# Patient Record
Sex: Male | Born: 1965 | Race: Black or African American | Hispanic: No | Marital: Single | Smoking: Never smoker
Health system: Southern US, Community
[De-identification: ages and names within clinical notes are randomized; demographics above are authoritative.]

## PROBLEM LIST (undated history)

## (undated) DIAGNOSIS — N529 Male erectile dysfunction, unspecified: Secondary | ICD-10-CM

## (undated) DIAGNOSIS — I10 Essential (primary) hypertension: Secondary | ICD-10-CM

## (undated) HISTORY — PX: HAND SURGERY: SHX662

## (undated) HISTORY — DX: Male erectile dysfunction, unspecified: N52.9

## (undated) HISTORY — DX: Essential (primary) hypertension: I10

---

## 2017-10-10 DIAGNOSIS — I10 Essential (primary) hypertension: Secondary | ICD-10-CM

## 2017-10-10 DIAGNOSIS — C801 Malignant (primary) neoplasm, unspecified: Secondary | ICD-10-CM

## 2017-10-10 HISTORY — DX: Essential (primary) hypertension: I10

## 2017-10-10 HISTORY — DX: Malignant (primary) neoplasm, unspecified: C80.1

## 2018-05-02 ENCOUNTER — Other Ambulatory Visit: Payer: Self-pay

## 2018-05-02 ENCOUNTER — Emergency Department (HOSPITAL_COMMUNITY)
Admission: EM | Admit: 2018-05-02 | Discharge: 2018-05-02 | Disposition: A | Discharge status: Home / Self Care | Payer: Self-pay | Attending: Emergency Medicine | Admitting: Emergency Medicine

## 2018-05-02 ENCOUNTER — Emergency Department (HOSPITAL_COMMUNITY): Payer: Self-pay

## 2018-05-02 ENCOUNTER — Encounter (HOSPITAL_COMMUNITY): Payer: Self-pay

## 2018-05-02 DIAGNOSIS — D49511 Neoplasm of unspecified behavior of right kidney: Secondary | ICD-10-CM | POA: Insufficient documentation

## 2018-05-02 DIAGNOSIS — R197 Diarrhea, unspecified: Secondary | ICD-10-CM

## 2018-05-02 DIAGNOSIS — Z79899 Other long term (current) drug therapy: Secondary | ICD-10-CM | POA: Insufficient documentation

## 2018-05-02 DIAGNOSIS — I10 Essential (primary) hypertension: Secondary | ICD-10-CM

## 2018-05-02 DIAGNOSIS — R9389 Abnormal findings on diagnostic imaging of other specified body structures: Secondary | ICD-10-CM

## 2018-05-02 LAB — COMPREHENSIVE METABOLIC PANEL
ALBUMIN: 4.3 g/dL (ref 3.5–5.0)
ALT: 21 U/L (ref 0–44)
ANION GAP: 8 (ref 5–15)
AST: 23 U/L (ref 15–41)
Alkaline Phosphatase: 108 U/L (ref 38–126)
BUN: 12 mg/dL (ref 6–20)
CHLORIDE: 106 mmol/L (ref 98–111)
CO2: 29 mmol/L (ref 22–32)
CREATININE: 1.16 mg/dL (ref 0.61–1.24)
Calcium: 9.7 mg/dL (ref 8.9–10.3)
GFR calc Af Amer: >60 mL/min (ref >60)
Glucose, Bld: 132 mg/dL — ABNORMAL HIGH (ref 70–99)
POTASSIUM: 4.7 mmol/L (ref 3.5–5.1)
Sodium: 143 mmol/L (ref 135–145)
Total Bilirubin: 0.6 mg/dL (ref 0.3–1.2)
Total Protein: 8.4 g/dL — ABNORMAL HIGH (ref 6.5–8.1)

## 2018-05-02 LAB — URINALYSIS, ROUTINE W REFLEX MICROSCOPIC
Bilirubin Urine: NEGATIVE
GLUCOSE, UA: NEGATIVE mg/dL
HGB URINE DIPSTICK: NEGATIVE
KETONES UR: NEGATIVE mg/dL
Leukocytes, UA: NEGATIVE
Nitrite: NEGATIVE
PROTEIN: NEGATIVE mg/dL
Specific Gravity, Urine: 1.002 — ABNORMAL LOW (ref 1.005–1.030)
pH: 7 (ref 5.0–8.0)

## 2018-05-02 LAB — CBC
HCT: 45.2 % (ref 39.0–52.0)
Hemoglobin: 15.3 g/dL (ref 13.0–17.0)
MCH: 29.8 pg (ref 26.0–34.0)
MCHC: 33.8 g/dL (ref 30.0–36.0)
MCV: 88.1 fL (ref 78.0–100.0)
PLATELETS: 293 10*3/uL (ref 150–400)
RBC: 5.13 MIL/uL (ref 4.22–5.81)
RDW: 12.9 % (ref 11.5–15.5)
WBC: 4.6 10*3/uL (ref 4.0–10.5)

## 2018-05-02 LAB — LIPASE, BLOOD: LIPASE: 132 U/L — AB (ref 11–51)

## 2018-05-02 MED ORDER — CLONIDINE HCL 0.1 MG PO TABS
0.2000 mg | ORAL_TABLET | Freq: Once | ORAL | Status: AC
  Administered 2018-05-02: 0.2 mg via ORAL
  Filled 2018-05-02: qty 2

## 2018-05-02 MED ORDER — SODIUM CHLORIDE 0.9 % IV BOLUS
1000.0000 mL | Freq: Once | INTRAVENOUS | Status: AC
  Administered 2018-05-02: 1000 mL via INTRAVENOUS

## 2018-05-02 MED ORDER — CLONIDINE HCL 0.1 MG PO TABS
0.1000 mg | ORAL_TABLET | Freq: Once | ORAL | Status: AC
  Administered 2018-05-02: 0.1 mg via ORAL
  Filled 2018-05-02: qty 1

## 2018-05-02 MED ORDER — IOPAMIDOL (ISOVUE-300) INJECTION 61%
INTRAVENOUS | Status: AC
  Filled 2018-05-02: qty 100

## 2018-05-02 MED ORDER — IOPAMIDOL (ISOVUE-300) INJECTION 61%
100.0000 mL | Freq: Once | INTRAVENOUS | Status: AC | PRN
  Administered 2018-05-02: 100 mL via INTRAVENOUS

## 2018-05-02 NOTE — ED Provider Notes (Signed)
[facility] DEPT Provider Note   CSN: [id number] Arrival date & time: 05/02/18  1105     History   Chief Complaint Chief Complaint  Patient presents with  . Abdominal Pain    HPI Erik Choi is a 52 y.o. male.  He states he has been sick since Monday with crampy low abdominal pain and multiple episodes of diarrhea that it is yellow and green.  He says the frequency of the diarrhea is lessening over the last 24 hours.  It is associated with feeling very lightheaded and dizzy.  States is been drinking a lot of water to compensate.  He works outside at a [employer] and has been quite hot over the last period of time.  He does think he might of eaten something suspicious prior to the start of these events.  He is also worried that he took his blood pressure and found to be quite elevated.  He is never been told he has high blood pressure and as of 6 months ago he had a normal blood pressure during primary care doctor visit.  He was taking a lot of ibuprofen recently for dental pain and had a dental visit about 2 weeks ago.  He says at that time he was not on antibiotics but was given some pain medicine that he stopped with.  He does not recall the name of this medicine.   The history is provided by the patient.  Abdominal Pain   This is a new problem. The current episode started 2 days ago. Episode frequency: intermittently. The problem has been gradually improving. The pain is associated with suspicious food intake. The pain is located in the generalized abdominal region. The quality of the pain is cramping. The pain is moderate. Associated symptoms include diarrhea and nausea. Pertinent negatives include anorexia, fever, belching, flatus, hematochezia, melena, vomiting, constipation, dysuria, frequency, hematuria, headaches, arthralgias and myalgias. Nothing aggravates the symptoms. The symptoms are relieved by bowel activity.    History reviewed. No pertinent  past medical history.  There are no active problems to display for this patient.   History reviewed. No pertinent surgical history.      Home Medications    Prior to Admission medications   Medication Sig Start Date End Date Taking? Authorizing Provider  acetaminophen-codeine (TYLENOL #3) 300-30 MG tablet TAKE 1 TABLET BY MOUTH EVERY 4 TO 6 HOURS AS NEEDED FOR PAIN 04/23/18  Yes [provider]  amoxicillin (AMOXIL) 500 MG capsule TAKE 1 CAPSULE BY MOUTH THREE TIMES DAILY UNTIL GONE 04/23/18  Yes [provider]  bismuth subsalicylate (PEPTO BISMOL) 262 MG/15ML suspension Take 30 mLs by mouth daily as needed for diarrhea or loose stools.   Yes [provider]  Multiple Vitamin (MULTIVITAMIN WITH MINERALS) TABS tablet Take 1 tablet by mouth daily.   Yes [provider]  naproxen sodium (ALEVE) 220 MG tablet Take 440 mg by mouth every 4 (four) hours as needed (tooth pain).   Yes [provider]    Family History No family history on file.  Social History Social History   Tobacco Use  . Smoking status: Never Smoker  . Smokeless tobacco: Never Used  Substance Use Topics  . Alcohol use: Never    Frequency: Never  . Drug use: Never     Allergies   Patient has no known allergies.   Review of Systems Review of Systems  Constitutional: Negative for chills and fever.  HENT: Negative for ear pain  and sore throat.   Eyes: Negative for pain and visual disturbance.  Respiratory: Negative for cough and shortness of breath.   Cardiovascular: Negative for chest pain and palpitations.  Gastrointestinal: Positive for abdominal pain, diarrhea and nausea. Negative for anorexia, constipation, flatus, hematochezia, melena and vomiting.  Genitourinary: Negative for dysuria, frequency and hematuria.  Musculoskeletal: Negative for arthralgias, back pain and myalgias.  Skin: Negative for color change and rash.  Neurological: Positive for dizziness  and light-headedness. Negative for seizures, syncope and headaches.  All other systems reviewed and are negative.    Physical Exam Updated Vital Signs BP (!) 200/118 (BP Location: Right Arm)   Pulse 77   Temp 98 F (36.7 C) (Oral)   Resp 14   Ht 6\' 1"  (1.854 m)   Wt 86.2 kg (190 lb)   SpO2 100%   BMI 25.07 kg/m   Physical Exam  Constitutional: He appears well-developed and well-nourished.  HENT:  Head: Normocephalic and atraumatic.  Right Ear: External ear normal.  Left Ear: External ear normal.  Nose: Nose normal.  Mouth/Throat: Oropharynx is clear and moist.  Eyes: Pupils are equal, round, and reactive to light. Conjunctivae and EOM are normal.  Neck: Neck supple.  Cardiovascular: Normal rate, regular rhythm, normal heart sounds and intact distal pulses.  No murmur heard. Pulmonary/Chest: Effort normal and breath sounds normal. No respiratory distress.  Abdominal: Soft. There is no tenderness. There is no rigidity and no guarding.  Musculoskeletal: He exhibits no edema, tenderness or deformity.  Neurological: He is alert.  Skin: Skin is warm and dry. Capillary refill takes less than 2 seconds.  Psychiatric: He has a normal mood and affect.  Nursing note and vitals reviewed.    ED Treatments / Results  Labs (all labs ordered are listed, but only abnormal results are displayed) Labs Reviewed  LIPASE, BLOOD - Abnormal; Notable for the following components:      Result Value   Lipase 132 (*)    All other components within normal limits  COMPREHENSIVE METABOLIC PANEL - Abnormal; Notable for the following components:   Glucose, Bld 132 (*)    Total Protein 8.4 (*)    All other components within normal limits  URINALYSIS, ROUTINE W REFLEX MICROSCOPIC - Abnormal; Notable for the following components:   Color, Urine COLORLESS (*)    Specific Gravity, Urine 1.002 (*)    All other components within normal limits  CBC    EKG None  Radiology Ct Abdomen Pelvis W  Contrast  Result Date: 05/02/2018 CLINICAL DATA:  Dental procedure 2 weeks ago with elevated blood pressure at the time of the procedure abdominal pain starting Sunday. Persistent diarrhea on Monday. EXAM: CT ABDOMEN AND PELVIS WITH CONTRAST TECHNIQUE: Multidetector CT imaging of the abdomen and pelvis was performed using the standard protocol following bolus administration of intravenous contrast. CONTRAST:  140mL ISOVUE-300 IOPAMIDOL (ISOVUE-300) INJECTION 61% COMPARISON:  None. FINDINGS: Lower chest: No acute abnormality. Hepatobiliary: No focal liver abnormality is seen. No gallstones, gallbladder wall thickening, or biliary dilatation. Pancreas: Unremarkable. No pancreatic ductal dilatation or surrounding inflammatory changes. Spleen: Normal in size without focal abnormality. Adrenals/Urinary Tract: Adrenal glands appear normal. 2.8 cm hypodense mass exophytic to the lateral cortex of the RIGHT kidney, with Hounsfield unit measurements of 33-34 on both portal venous phase and delayed phase images. No renal stone, hydronephrosis or perinephric inflammation bilaterally. No ureteral or bladder calculi identified. Bladder appears normal. Stomach/Bowel: No dilated large or small bowel loops. No convincing bowel wall  thickening or evidence of bowel wall inflammation seen. Appendix is normal. Stomach appears normal, partially decompressed. Vascular/Lymphatic: Mild aortic atherosclerosis. No significant vascular findings are present. No enlarged abdominal or pelvic lymph nodes. Reproductive: Prostate is unremarkable. Other: No free fluid or abscess collection. No free intraperitoneal air. Musculoskeletal: Degenerative changes throughout the mid and lower lumbar spine, mild to moderate in degree, possible associated nerve root impingement at 1 or more levels. No acute or suspicious osseous finding. IMPRESSION: 1. **An incidental finding of potential clinical significance has been found. Indeterminate mass within the  RIGHT kidney, measuring 2.8 cm, with density measurements of 33-34 Hounsfield units on both portal venous phase and delayed phase images. By CT density measurements, this is not compatible with a benign simple cyst. Neoplastic mass not excluded. Per consensus guidelines, recommend nonemergent MRI with and without contrast or renal protocol CT with and without contrast for further characterization.** 2. No acute findings. No bowel obstruction or convincing evidence of bowel wall inflammation. No renal or ureteral calculi. No free fluid or free intraperitoneal air. Appendix is normal. 3. 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 If any associated chronic low back pain and/or radiculopathic symptoms, would consider nonemergent lumbar spine MRI to exclude associated nerve root impingement. Electronically Signed   By: Franki Cabot M.D.   On: 05/02/2018 14:01    Procedures Procedures (including critical care time)  Medications Ordered in ED Medications  cloNIDine (CATAPRES) tablet 0.1 mg (0.1 mg Oral Given 05/02/18 1220)  sodium chloride 0.9 % bolus 1,000 mL (0 mLs Intravenous Stopped 05/02/18 1322)  iopamidol (ISOVUE-300) 61 % injection 100 mL (100 mLs Intravenous Contrast Given 05/02/18 1319)  cloNIDine (CATAPRES) tablet 0.2 mg (0.2 mg Oral Given 05/02/18 1429)     Initial Impression / Assessment and Plan / ED Course  I have reviewed the triage vital signs and the nursing notes.  Pertinent labs & imaging results that were available during my care of the patient were reviewed by me and considered in my medical decision making (see chart for details).  Clinical Course as of May 04 1727  Wed May 02, 2018  1217 Patient's lipase elevated on his blood work at 132.  I will put him in for CT.  The rest of her work-up seems fairly normal with normal BUN and creatinine clean  urine   [MB]  1217 .   [MB]  1522 Patient feels improved and has been tolerating p.o.  I reviewed his lab work with him including his elevated lipase and his abnormality on abdominal CT.  Currently he has no PCP so I gave him the information for [facility].  He understands that he will need further testing and indications for return.   [MB]    Clinical Course User Index [MB] Hayden Rasmussen, MD     Final Clinical Impressions(s) / ED Diagnoses   Final diagnoses:  Diarrhea, unspecified type  Hypertension, unspecified type  Abnormal finding on radiology exam    ED Discharge Orders    None       Hayden Rasmussen, MD 05/03/18 1729

## 2018-05-02 NOTE — ED Triage Notes (Signed)
Pt states that he had a dental procedure 2 weeks ago, where his BP was elevated. Then on Sunday, his abdomen started hurting. He again took his BP, 180s sys. Pt states that he had "non stop" diarrhea on Monday morning. Pt states that let up some, but he has no appetite. Denies nausea and vomitting. Pt states that prior to the dentist visit, he was taking 5-6 advil at a time to relieve the pain, and is concerned that may have caused this.

## 2018-05-02 NOTE — Discharge Instructions (Signed)
You were evaluated in the emergency department for diarrhea and elevated blood pressure.  You had lab work and a CAT scan.  Your lipase was elevated but there is no evidence of any pancreas inflammation on the CAT scan.  You also had an abnormal finding on the CAT scan of a mass in the kidney that will need follow-up.  I have included the report at the end of this.  Your symptoms of diarrhea have improved here and you were tolerating food and fluids well.  Your blood pressure was also mildly improved here.  He will need to continue to check your blood pressure and to follow-up with primary care.  If you have any worsening symptoms please return to the emergency room.  IMPRESSION:  1. **An incidental finding of potential clinical significance has  been found. Indeterminate mass within the RIGHT kidney, measuring  2.8 cm, with density measurements of 33-34 Hounsfield units on both  portal venous phase and delayed phase images. By CT density  measurements, this is not compatible with a benign simple cyst.  Neoplastic mass not excluded. Per consensus guidelines, recommend  nonemergent MRI with and without contrast or renal protocol CT with  and without contrast for further characterization.**  2. No acute findings. No bowel obstruction or convincing evidence of  bowel wall inflammation. No renal or ureteral calculi. No free fluid  or free intraperitoneal air. Appendix is normal.  3. 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 If any  associated chronic low back pain and/or radiculopathic symptoms,  would consider nonemergent lumbar spine MRI to exclude associated  nerve root impingement.

## 2018-05-02 NOTE — ED Notes (Signed)
Pt has been checked on at this time. Pt was given water upon request.

## 2018-05-04 ENCOUNTER — Other Ambulatory Visit: Payer: Self-pay

## 2018-05-04 ENCOUNTER — Emergency Department (HOSPITAL_BASED_OUTPATIENT_CLINIC_OR_DEPARTMENT_OTHER)
Admission: EM | Admit: 2018-05-04 | Discharge: 2018-05-05 | Disposition: A | Discharge status: Home / Self Care | Payer: Self-pay | Attending: Emergency Medicine | Admitting: Emergency Medicine

## 2018-05-04 ENCOUNTER — Emergency Department (HOSPITAL_COMMUNITY)
Admission: EM | Admit: 2018-05-04 | Discharge: 2018-05-04 | Disposition: A | Discharge status: Home / Self Care | Payer: Self-pay

## 2018-05-04 ENCOUNTER — Encounter (HOSPITAL_BASED_OUTPATIENT_CLINIC_OR_DEPARTMENT_OTHER): Payer: Self-pay | Admitting: *Deleted

## 2018-05-04 DIAGNOSIS — I10 Essential (primary) hypertension: Secondary | ICD-10-CM | POA: Insufficient documentation

## 2018-05-04 LAB — BASIC METABOLIC PANEL
ANION GAP: 7 (ref 5–15)
BUN: 18 mg/dL (ref 6–20)
CHLORIDE: 102 mmol/L (ref 98–111)
CO2: 28 mmol/L (ref 22–32)
Calcium: 9.3 mg/dL (ref 8.9–10.3)
Creatinine, Ser: 1.35 mg/dL — ABNORMAL HIGH (ref 0.61–1.24)
GFR calc Af Amer: >60 mL/min (ref >60)
GFR, EST NON AFRICAN AMERICAN: 59 mL/min — AB (ref >60)
GLUCOSE: 98 mg/dL (ref 70–99)
POTASSIUM: 4 mmol/L (ref 3.5–5.1)
Sodium: 137 mmol/L (ref 135–145)

## 2018-05-04 LAB — CBC WITH DIFFERENTIAL/PLATELET
BASOS ABS: 0 10*3/uL (ref 0.0–0.1)
Basophils Relative: 1 %
Eosinophils Absolute: 0.2 10*3/uL (ref 0.0–0.7)
Eosinophils Relative: 3 %
HEMATOCRIT: 42.3 % (ref 39.0–52.0)
Hemoglobin: 14.4 g/dL (ref 13.0–17.0)
LYMPHS ABS: 2.2 10*3/uL (ref 0.7–4.0)
Lymphocytes Relative: 37 %
MCH: 29.4 pg (ref 26.0–34.0)
MCHC: 34 g/dL (ref 30.0–36.0)
MCV: 86.3 fL (ref 78.0–100.0)
Monocytes Absolute: 0.6 10*3/uL (ref 0.1–1.0)
Monocytes Relative: 10 %
NEUTROS PCT: 49 %
Neutro Abs: 3 10*3/uL (ref 1.7–7.7)
Platelets: 263 10*3/uL (ref 150–400)
RBC: 4.9 MIL/uL (ref 4.22–5.81)
RDW: 12.9 % (ref 11.5–15.5)
WBC: 5.9 10*3/uL (ref 4.0–10.5)

## 2018-05-04 MED ORDER — AMLODIPINE BESYLATE 5 MG PO TABS: 5.0000 mg | ORAL_TABLET | Freq: Every day | ORAL | 0 refills | Status: DC

## 2018-05-04 MED ORDER — AMLODIPINE BESYLATE 5 MG PO TABS
5.0000 mg | ORAL_TABLET | Freq: Once | ORAL | Status: AC
  Administered 2018-05-04: 5 mg via ORAL
  Filled 2018-05-04: qty 1

## 2018-05-04 NOTE — ED Provider Notes (Signed)
[facility] DEPT [facility] Provider Note: Georgena Spurling, MD, FACEP  CSN: [id number] MRN: [id number] ARRIVAL: 05/04/18 at 2104 ROOM: [place]  Hypertension   HISTORY OF PRESENT ILLNESS  05/04/18 10:59 PM Erik Choi is a 52 y.o. male who was told, at a dental visit for an extraction 2 weeks ago, that his blood pressure was elevated.  He reports it was 979 systolic.  He has had persistent elevated blood pressures for the past 2 weeks.  Today it was as high as 213/130.  He was seen at [facility] emergency department and referred to [facility] but has not been able to get into be seen.  He was not started on antihypertensives.  He denies chest pain or shortness of breath.  He does complain of headache, moderate in severity and primarily on the left-hand side of his head.  He is also having blurred vision and generalized weakness.   History reviewed. No pertinent past medical history.  History reviewed. No pertinent surgical history.  No family history on file.  Social History   Tobacco Use  . Smoking status: Never Smoker  . Smokeless tobacco: Never Used  Substance Use Topics  . Alcohol use: Never    Frequency: Never  . Drug use: Never    Prior to Admission medications   Medication Sig Start Date End Date Taking? Authorizing Provider  Multiple Vitamin (MULTIVITAMIN WITH MINERALS) TABS tablet Take 1 tablet by mouth daily.   Yes [provider]  acetaminophen-codeine (TYLENOL #3) 300-30 MG tablet TAKE 1 TABLET BY MOUTH EVERY 4 TO 6 HOURS AS NEEDED FOR PAIN 04/23/18   [provider]  amoxicillin (AMOXIL) 500 MG capsule TAKE 1 CAPSULE BY MOUTH THREE TIMES DAILY UNTIL GONE 04/23/18   [provider]  bismuth subsalicylate (PEPTO BISMOL) 262 MG/15ML suspension Take 30 mLs by mouth daily as needed for diarrhea or loose stools.    [provider]  naproxen sodium (ALEVE) 220 MG tablet Take 440 mg  by mouth every 4 (four) hours as needed (tooth pain).    [provider]    Allergies Patient has no known allergies.   REVIEW OF SYSTEMS  Negative except as noted here or in the History of Present Illness.   PHYSICAL EXAMINATION  Initial Vital Signs Blood pressure (!) 187/127, pulse 67, temperature 98.1 F (36.7 C), temperature source Oral, resp. rate 18, height 6\' 1"  (1.854 m), weight 86.2 kg (190 lb), SpO2 100 %.  Examination General: Well-developed, well-nourished male in no acute distress; appearance consistent with age of record HENT: normocephalic; atraumatic Eyes: pupils equal, round and reactive to light; extraocular muscles intact; no retinal hemorrhage seen Neck: supple Heart: regular rate and rhythm Lungs: clear to auscultation bilaterally Abdomen: soft; nondistended; nontender; bowel sounds present Extremities: No deformity; full range of motion; pulses normal Neurologic: Awake, alert and oriented; motor function intact in all extremities and symmetric; no facial droop Skin: Warm and dry Psychiatric: Normal mood and affect   RESULTS  Summary of this visit's results, reviewed by myself:   EKG Interpretation  Date/Time:    Ventricular Rate:    PR Interval:    QRS Duration:   QT Interval:    QTC Calculation:   R Axis:     Text Interpretation:        Laboratory Studies: Results for orders placed or performed during the hospital encounter of 05/04/18 (from the past 24 hour(s))  CBC with Differential/Platelet  Status: None   Collection Time: 05/04/18 11:18 PM  Result Value Ref Range   WBC 5.9 4.0 - 10.5 K/uL   RBC 4.90 4.22 - 5.81 MIL/uL   Hemoglobin 14.4 13.0 - 17.0 g/dL   HCT 42.3 39.0 - 52.0 %   MCV 86.3 78.0 - 100.0 fL   MCH 29.4 26.0 - 34.0 pg   MCHC 34.0 30.0 - 36.0 g/dL   RDW 12.9 11.5 - 15.5 %   Platelets 263 150 - 400 K/uL   Neutrophils Relative % 49 %   Neutro Abs 3.0 1.7 - 7.7 K/uL   Lymphocytes Relative 37 %   Lymphs Abs  2.2 0.7 - 4.0 K/uL   Monocytes Relative 10 %   Monocytes Absolute 0.6 0.1 - 1.0 K/uL   Eosinophils Relative 3 %   Eosinophils Absolute 0.2 0.0 - 0.7 K/uL   Basophils Relative 1 %   Basophils Absolute 0.0 0.0 - 0.1 K/uL  Basic metabolic panel     Status: Abnormal   Collection Time: 05/04/18 11:18 PM  Result Value Ref Range   Sodium 137 135 - 145 mmol/L   Potassium 4.0 3.5 - 5.1 mmol/L   Chloride 102 98 - 111 mmol/L   CO2 28 22 - 32 mmol/L   Glucose, Bld 98 70 - 99 mg/dL   BUN 18 6 - 20 mg/dL   Creatinine, Ser 1.35 (H) 0.61 - 1.24 mg/dL   Calcium 9.3 8.9 - 10.3 mg/dL   GFR calc non Af Amer 59 (L) >60 mL/min   GFR calc Af Amer >60 >60 mL/min   Anion gap 7 5 - 15   Imaging Studies: No results found.  ED COURSE and MDM  Nursing notes and initial vitals signs, including pulse oximetry, reviewed.  Vitals:   05/04/18 2111 05/04/18 2113 05/04/18 2320  BP:  (!) 187/127 (!) 187/119  Pulse:  67 67  Resp:  18 16  Temp:  98.1 F (36.7 C)   TempSrc:  Oral   SpO2:  100% 100%  Weight: 86.2 kg (190 lb)    Height: 6\' 1"  (1.854 m)     Given patient's persistent symptoms and documented elevated blood pressure we will start him on Norvasc.  As noted above he has an appointment pending with [facility].  PROCEDURES    ED DIAGNOSES     ICD-10-CM   1. Essential hypertension I10        Marice Angelino, MD 05/04/18 2350

## 2018-05-04 NOTE — ED Triage Notes (Signed)
Hypertension x 2 weeks. Headache. Blurred vision. No energy. Symptoms x 2 weeks but worse x 3 days.

## 2018-05-05 MED ORDER — ACETAMINOPHEN 325 MG PO TABS
650.0000 mg | ORAL_TABLET | Freq: Once | ORAL | Status: AC
  Administered 2018-05-05: 650 mg via ORAL
  Filled 2018-05-05: qty 2

## 2018-05-17 ENCOUNTER — Inpatient Hospital Stay: Payer: Self-pay

## 2018-05-23 NOTE — Progress Notes (Signed)
Patient ID: Erik Choi, male   DOB: 17-Feb-1966, 52 y.o.   MRN: [id number]   Calhoun Reichardt, is a 52 y.o. male  NTZ:[id number]  QPR:[id number]  DOB - 1966/01/11  Subjective:  Chief Complaint and HPI: Erik Choi is a 52 y.o. male here today to establish care and for a follow up visit Seen in ED 05/04/2018 for uncontrolled htn and was started on Norvasc. Non-fasting blood sugar noted to be 132 about 3 weeks ago. No h/o diabetes. He is doing well on 5mg  amlodipine.  No CP/dizziness/lightheadedness/HA.   ED/Hospital notes reviewed.   Social History:  Married, exercises regularly Family history:  +diabetes  ROS:   Constitutional:  No f/c, No night sweats, No unexplained weight loss. EENT:  No vision changes, No blurry vision, No hearing changes. No mouth, throat, or ear problems.  Respiratory: No cough, No SOB Cardiac: No CP, no palpitations GI:  No abd pain, No N/V/D. GU: No Urinary s/sx Musculoskeletal: No joint pain Neuro: No headache, no dizziness, no motor weakness.  Skin: No rash Endocrine:  No polydipsia. No polyuria.  Psych: Denies SI/HI  Problem  Htn (Hypertension)    ALLERGIES: No Known Allergies  PAST MEDICAL HISTORY: History reviewed. No pertinent past medical history.  MEDICATIONS AT HOME: Prior to Admission medications   Medication Sig Start Date End Date Taking? Authorizing Provider  amLODipine (NORVASC) 10 MG tablet Take 1 tablet (10 mg total) by mouth daily. 05/24/18   Argentina Donovan, PA-C  aspirin EC 81 MG tablet Take 1 tablet (81 mg total) by mouth daily. 05/24/18   Argentina Donovan, PA-C  Multiple Vitamin (MULTIVITAMIN WITH MINERALS) TABS tablet Take 1 tablet by mouth daily.    [provider]     Objective:  EXAM:   Vitals:   05/24/18 1353  BP: (!) 158/98  Pulse: 70  Resp: 18  Temp: 98.8 F (37.1 C)  TempSrc: Oral  SpO2: 100%  Weight: 184 lb (83.5 kg)  Height: 6\' 1"  (1.854 m)    General appearance : A&OX3. NAD.  Non-toxic-appearing HEENT: Atraumatic and Normocephalic.  PERRLA. EOM intact.  Neck: supple, no JVD. No cervical lymphadenopathy. No thyromegaly Chest/Lungs:  Breathing-non-labored, Good air entry bilaterally, breath sounds normal without rales, rhonchi, or wheezing  CVS: S1 S2 regular, no murmurs, gallops, rubs  Extremities: Bilateral Lower Ext shows no edema, both legs are warm to touch with = pulse throughout Neurology:  CN II-XII grossly intact, Non focal.   Psych:  TP linear. J/I WNL. Normal speech. Appropriate eye contact and affect.  Skin:  No Rash  Data Review Lab Results  Component Value Date   HGBA1C 5.3 05/24/2018     Assessment & Plan   1. Hyperglycemia A1C=5.3 - HgB A1c  2. Hypertension, unspecified type Improving but not at goal.  Increase dose amlodipine.  Check blood pressure 3-5 times/week and record and bring to next visit.   - amLODipine (NORVASC) 10 MG tablet; Take 1 tablet (10 mg total) by mouth daily.  Dispense: 90 tablet; Refill: 3 - aspirin EC 81 MG tablet; Take 1 tablet (81 mg total) by mouth daily.  Dispense: 100 tablet; Refill: 3 for heart protective benefit.  (No h/o bleeding d/o or ulcers.)  3. Encounter for examination following treatment at hospital Improving.       Patient have been counseled extensively about nutrition and exercise  Return in about 3 weeks (around 06/14/2018) for [facility] for BP check then 6 weeks to establish care-htn.  The patient  was given clear instructions to go to ER or return to medical center if symptoms don't improve, worsen or new problems develop. The patient verbalized understanding. The patient was told to call to get lab results if they haven't heard anything in the next week.     Freeman Caldron, PA-C [facility] and [facility], [place]   05/24/2018, 2:05 PM

## 2018-05-24 ENCOUNTER — Ambulatory Visit: Payer: Self-pay | Attending: Physician Assistant | Admitting: Physician Assistant

## 2018-05-24 VITALS — BP 158/98 | HR 70 | Temp 98.8°F | Resp 18 | Ht 73.0 in | Wt 184.0 lb

## 2018-05-24 DIAGNOSIS — I10 Essential (primary) hypertension: Secondary | ICD-10-CM

## 2018-05-24 DIAGNOSIS — Z79899 Other long term (current) drug therapy: Secondary | ICD-10-CM | POA: Insufficient documentation

## 2018-05-24 DIAGNOSIS — Z09 Encounter for follow-up examination after completed treatment for conditions other than malignant neoplasm: Secondary | ICD-10-CM

## 2018-05-24 DIAGNOSIS — R739 Hyperglycemia, unspecified: Secondary | ICD-10-CM

## 2018-05-24 DIAGNOSIS — Z7982 Long term (current) use of aspirin: Secondary | ICD-10-CM | POA: Insufficient documentation

## 2018-05-24 LAB — POCT GLYCOSYLATED HEMOGLOBIN (HGB A1C): HbA1c POC (<> result, manual entry): 5.3 % (ref 4.0–5.6)

## 2018-05-24 MED ORDER — AMLODIPINE BESYLATE 10 MG PO TABS: 10.0000 mg | ORAL_TABLET | Freq: Every day | ORAL | 3 refills | Status: DC

## 2018-05-24 MED ORDER — ASPIRIN EC 81 MG PO TBEC: 81.0000 mg | DELAYED_RELEASE_TABLET | Freq: Every day | ORAL | 3 refills | Status: DC

## 2018-05-24 MED FILL — AMLODIPINE BESYLATE 10 MG T: 10 | 30 days supply | Qty: 30 | Fill #0

## 2018-05-24 NOTE — Patient Instructions (Signed)
Check blood pressure 3-5 times weekly and record and bring to next visit

## 2018-05-28 ENCOUNTER — Ambulatory Visit: Payer: Self-pay

## 2018-06-14 ENCOUNTER — Encounter: Payer: Self-pay | Admitting: Pharmacist

## 2018-06-18 ENCOUNTER — Encounter: Payer: Self-pay | Admitting: Pharmacist

## 2018-06-19 ENCOUNTER — Ambulatory Visit: Payer: Self-pay | Attending: Family Medicine | Admitting: Pharmacist

## 2018-06-19 ENCOUNTER — Encounter: Payer: Self-pay | Admitting: Pharmacist

## 2018-06-19 VITALS — BP 113/78 | HR 59

## 2018-06-19 DIAGNOSIS — R51 Headache: Secondary | ICD-10-CM | POA: Insufficient documentation

## 2018-06-19 DIAGNOSIS — Z79899 Other long term (current) drug therapy: Secondary | ICD-10-CM | POA: Insufficient documentation

## 2018-06-19 DIAGNOSIS — I10 Essential (primary) hypertension: Secondary | ICD-10-CM

## 2018-06-19 DIAGNOSIS — R42 Dizziness and giddiness: Secondary | ICD-10-CM | POA: Insufficient documentation

## 2018-06-19 DIAGNOSIS — Z833 Family history of diabetes mellitus: Secondary | ICD-10-CM | POA: Insufficient documentation

## 2018-06-19 MED FILL — AMLODIPINE BESYLATE 10 MG T: 10 | 30 days supply | Qty: 30 | Fill #1

## 2018-06-19 NOTE — Patient Instructions (Signed)

## 2018-06-19 NOTE — Progress Notes (Signed)
   S:    PCP: Dr. Chapman Fitch  Patient arrives in good spirits. Presents to the clinic for hypertension evaluation, counseling, and management. Patient was last seen and referred by Levada Dy on 05/24/18.  Amlodipine increased to 10 mg daily.  Denies CP, SOB, or HA. Does endorse some dizziness/HA that occurs at home.  Patient reports adherence with medications.  Current BP Medications include:   - amlodipine 10 mg daily  Dietary habits include:  - does not limit salt - not DASH compliant/cook Exercise habits include: - 2 days / gym 40 minutes Family / Social history:  - FH: DM (father, sisters) - Tobacco: denies - Alcohol: denies  Home BP readings:  Reports 140s/80s. Of note, he reports that if he rests his BP will sometimes read in the lower 120s/70s since amlodipine increase.  O:  L arm after 5 minutes rest: 113/78, HR 59  Last 3 Office BP readings: BP Readings from Last 3 Encounters:  05/24/18 (!) 158/98  05/05/18 (!) 188/107  05/02/18 (!) 163/99    BMET    Component Value Date/Time   NA 137 05/04/2018 2318   K 4.0 05/04/2018 2318   CL 102 05/04/2018 2318   CO2 28 05/04/2018 2318   GLUCOSE 98 05/04/2018 2318   BUN 18 05/04/2018 2318   CREATININE 1.35 (H) 05/04/2018 2318   CALCIUM 9.3 05/04/2018 2318   GFRNONAA 59 (L) 05/04/2018 2318   GFRAA >60 05/04/2018 2318    Renal function: CrCl cannot be calculated (Patient's most recent lab result is older than the maximum 21 days allowed.).  A/P: Hypertension currently controlled. BP Goal <130/80 mmHg. Patient is adherent with current medications. Of note, patient with negative history for stroke, ACS, CAD, or diabetes. No compelling indication for aspirin at this time.   Pt did report dizziness/HA at home that comes and goes. Discussed possibility of relation to BP. Will have him f/u with PCP for additional work-up.   -Continued amlodipine 10 mg daily  -Discontinued aspirin  -Counseled on lifestyle modifications for blood  pressure control including reduced dietary sodium, increased exercise, adequate sleep  Results reviewed and written information provided.  Total time in face-to-face counseling 15 minutes.   F/U Clinic Visit 07/09/18.   Benard Halsted, PharmD, [place] [phone]

## 2018-07-05 ENCOUNTER — Ambulatory Visit: Payer: Self-pay | Admitting: Family Medicine

## 2018-07-09 ENCOUNTER — Ambulatory Visit: Payer: Self-pay | Attending: Family Medicine | Admitting: Nurse Practitioner

## 2018-07-09 ENCOUNTER — Encounter: Payer: Self-pay | Admitting: Family Medicine

## 2018-07-09 VITALS — BP 142/87 | HR 72 | Temp 99.0°F | Resp 18 | Ht 73.0 in | Wt 187.0 lb

## 2018-07-09 DIAGNOSIS — Z79899 Other long term (current) drug therapy: Secondary | ICD-10-CM | POA: Insufficient documentation

## 2018-07-09 DIAGNOSIS — M545 Low back pain, unspecified: Secondary | ICD-10-CM

## 2018-07-09 DIAGNOSIS — G8929 Other chronic pain: Secondary | ICD-10-CM | POA: Insufficient documentation

## 2018-07-09 DIAGNOSIS — I10 Essential (primary) hypertension: Secondary | ICD-10-CM | POA: Insufficient documentation

## 2018-07-09 MED ORDER — CYCLOBENZAPRINE HCL 10 MG PO TABS: 10.0000 mg | ORAL_TABLET | Freq: Three times a day (TID) | ORAL | 1 refills | Status: AC | PRN

## 2018-07-09 MED ORDER — MELOXICAM 7.5 MG PO TABS: 7.5000 mg | ORAL_TABLET | Freq: Every day | ORAL | 1 refills | Status: DC

## 2018-07-09 MED FILL — MELOXICAM 7.5 MG TABLET: 7.5 | 30 days supply | Qty: 30 | Fill #0

## 2018-07-09 MED FILL — CYCLOBENZAPRINE 10 MG TAB: 10 | 20 days supply | Qty: 60 | Fill #0

## 2018-07-09 NOTE — Patient Instructions (Signed)
Chronic Back Pain When back pain lasts longer than 3 months, it is called chronic back pain.The cause of your back pain may not be known. Some common causes include:  Wear and tear (degenerative disease) of the bones, ligaments, or disks in your back.  Inflammation and stiffness in your back (arthritis).  People who have chronic back pain often go through certain periods in which the pain is more intense (flare-ups). Many people can learn to manage the pain with home care. Follow these instructions at home: Pay attention to any changes in your symptoms. Take these actions to help with your pain: Activity  Avoid bending and activities that make the problem worse.  Do not sit or stand in one place for long periods of time.  Take brief periods of rest throughout the day. This will reduce your pain. Resting in a lying or standing position is usually better than sitting to rest.  When you are resting for longer periods, mix in some mild activity or stretching between periods of rest. This will help to prevent stiffness and pain.  Get regular exercise. Ask your health care provider what activities are safe for you.  Do not lift anything that is heavier than 10 lb (4.5 kg). Always use proper lifting technique, which includes: ? Bending your knees. ? Keeping the load close to your body. ? Avoiding twisting. Managing pain  If directed, apply ice to the painful area. Your health care provider may recommend applying ice during the first 24-48 hours after a flare-up begins. ? Put ice in a plastic bag. ? Place a towel between your skin and the bag. ? Leave the ice on for 20 minutes, 2-3 times per day.  After icing, apply heat to the affected area as often as told by your health care provider. Use the heat source that your health care provider recommends, such as a moist heat pack or a heating pad. ? Place a towel between your skin and the heat source. ? Leave the heat on for 20-30  minutes. ? Remove the heat if your skin turns bright red. This is especially important if you are unable to feel pain, heat, or cold. You may have a greater risk of getting burned.  Try soaking in a warm tub.  Take over-the-counter and prescription medicines only as told by your health care provider.  Keep all follow-up visits as told by your health care provider. This is important. Contact a health care provider if:  You have pain that is not relieved with rest or medicine. Get help right away if:  You have weakness or numbness in one or both of your legs or feet.  You have trouble controlling your bladder or your bowels.  You have nausea or vomiting.  You have pain in your abdomen.  You have shortness of breath or you faint. This information is not intended to replace advice given to you by your health care provider. Make sure you discuss any questions you have with your health care provider. Document Released: 11/03/2004 Document Revised: 02/04/2016 Document Reviewed: 03/16/2015 Elsevier Interactive Patient Education  2018 [employer].  Muscle Pain, Adult Muscle pain (myalgia) may be mild or severe. In most cases, the pain lasts only a short time and it goes away without treatment. It is normal to feel some muscle pain after starting a workout program. Muscles that have not been used often will be sore at first. Muscle pain may also be caused by many other things, including:  Overuse or muscle strain, especially if you are not in shape. This is the most common cause of muscle pain.  Injury.  Bruises.  Viruses, such as the flu.  Infectious diseases.  A chronic condition that causes muscle tenderness, fatigue, and headache (fibromyalgia).  A condition, such as lupus, in which the body's disease-fighting system attacks other organs in the body (autoimmune or rheumatologic diseases).  Certain drugs, including ACE inhibitors and statins.  To diagnose the cause of your  muscle pain, your health care provider will do a physical exam and ask questions about the pain and when it began. If you have not had muscle pain for very long, your health care provider may want to wait before doing much testing. If your muscle pain has lasted a long time, your health care provider may want to run tests right away. In some cases, this may include tests to rule out certain conditions or illnesses. Treatment for muscle pain depends on the cause. Home care is often enough to relieve muscle pain. Your health care provider may also prescribe anti-inflammatory medicine. Follow these instructions at home: Activity  If overuse is causing your muscle pain: ? Slow down your activities until the pain goes away. ? Do regular, gentle exercises if you are not usually active. ? Warm up before exercising. Stretch before and after exercising. This can help lower the risk of muscle pain.  Do not continue working out if the pain is very bad. Bad pain could mean that you have injured a muscle. Managing pain and discomfort   If directed, apply ice to the sore muscle: ? Put ice in a plastic bag. ? Place a towel between your skin and the bag. ? Leave the ice on for 20 minutes, 2-3 times a day.  You may also alternate between applying ice and applying heat as told by your health care provider. To apply heat, use the heat source that your health care provider recommends, such as a moist heat pack or a heating pad. ? Place a towel between your skin and the heat source. ? Leave the heat on for 20-30 minutes. ? Remove the heat if your skin turns bright red. This is especially important if you are unable to feel pain, heat, or cold. You may have a greater risk of getting burned. Medicines  Take over-the-counter and prescription medicines only as told by your health care provider.  Do not drive or use heavy machinery while taking prescription pain medicine. Contact a health care provider if:  Your  muscle pain gets worse and medicines do not help.  You have muscle pain that lasts longer than 3 days.  You have a rash or fever along with muscle pain.  You have muscle pain after a tick bite.  You have muscle pain while working out, even though you are in good physical condition.  You have redness, soreness, or swelling along with muscle pain.  You have muscle pain after starting a new medicine or changing the dose of a medicine. Get help right away if:  You have trouble breathing.  You have trouble swallowing.  You have muscle pain along with a stiff neck, fever, and vomiting.  You have severe muscle weakness or cannot move part of your body. This information is not intended to replace advice given to you by your health care provider. Make sure you discuss any questions you have with your health care provider. Document Released: 08/18/2006 Document Revised: 04/15/2016 Document Reviewed: 02/16/2016 Elsevier Interactive  Patient Education  [employer].

## 2018-07-09 NOTE — Progress Notes (Signed)
Assessment & Plan:  Erik Choi was seen today for back pain.  Diagnoses and all orders for this visit:  Chronic right-sided low back pain without sciatica -     cyclobenzaprine (FLEXERIL) 10 MG tablet; Take 1 tablet (10 mg total) by mouth 3 (three) times daily as needed for muscle spasms. -     meloxicam (MOBIC) 7.5 MG tablet; Take 1 tablet (7.5 mg total) by mouth daily.   May alternate with heat and ice application for pain relief. May also alternate with acetaminophen  as prescribed for back pain relief.   Other alternatives include massage, acupuncture and water aerobics.    You must stay active and avoid a sedentary lifestyle.   Essential hypertension Continue all antihypertensives as prescribed.  Remember to bring in your blood pressure log with you for your follow up appointment.  DASH/Mediterranean Diets are healthier choices for HTN.    Patient has been counseled on age-appropriate routine health concerns for screening and prevention. These are reviewed and up-to-date. Referrals have been placed accordingly. Immunizations are up-to-date or declined.    Subjective:   Chief Complaint  Patient presents with  . Back Pain   HPI Erik Choi 52 y.o. male presents to office today to establish care. He has a history of Hypertension and chronic low back pain.   Back Pain: Patient presents for presents evaluation of chronic low back problems. 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 He denies any injury or trauma to his back in the past.  Symptoms are worst: no particular time of the day. Alleviating factors identifiable by patient are are sitting straight up. Exacerbating factors identifiable by patient are bending to pray (he is muslim) bending backwards, bending forwards, bending sideways and sitting. Treatments so far initiated by patient: heat application Previous workup: none. Previous treatments: none.    CHRONIC HYPERTENSION Disease  Monitoring  Blood pressure range:Not well controlled however he is checking his blood pressure at home and reports average readings 120/70-80s. He is in considerable pain today as well.  BP Readings from Last 3 Encounters:  07/09/18 (!) 142/87  06/19/18 113/78  05/24/18 (!) 158/98   Chest pain: no   Dyspnea: no   Claudication: no  Medication compliance: yes, taking amlodipine 10mg  daily  Medication Side Effects  Lightheadedness: no   Urinary frequency: no   Edema: no   Impotence: no  Preventitive Healthcare:  Exercise: yes   Diet Pattern: diet: general  Salt Restriction:  yes  Review of Systems  Constitutional: Negative for fever, malaise/fatigue and weight loss.  HENT: Negative.  Negative for nosebleeds.   Eyes: Negative.  Negative for blurred vision, double vision and photophobia.  Respiratory: Negative.  Negative for cough and shortness of breath.   Cardiovascular: Negative.  Negative for chest pain, palpitations and leg swelling.  Gastrointestinal: Negative.  Negative for heartburn, nausea and vomiting.  Musculoskeletal: Positive for back pain and myalgias.  Neurological: Negative.  Negative for dizziness, focal weakness, seizures and headaches.  Psychiatric/Behavioral: Negative.  Negative for suicidal ideas.    Past Medical History:  Diagnosis Date  . Hypertension     History reviewed. No pertinent surgical history.  Family History  Problem Relation Age of Onset  . Cancer Neg Hx   . Diabetes Neg Hx     Social History Reviewed with no changes to be made today.   Outpatient Medications Prior to Visit  Medication Sig Dispense Refill  . amLODipine (NORVASC) 10  MG tablet Take 1 tablet (10 mg total) by mouth daily. 90 tablet 3  . Multiple Vitamin (MULTIVITAMIN WITH MINERALS) TABS tablet Take 1 tablet by mouth daily.     No facility-administered medications prior to visit.     No Known Allergies     Objective:    BP (!) 142/87 (BP Location: Left Arm, Patient  Position: Sitting, Cuff Size: Normal)   Pulse 72   Temp 99 F (37.2 C) (Oral)   Resp 18   Ht 6\' 1"  (1.854 m)   Wt 187 lb (84.8 kg)   SpO2 100%   BMI 24.67 kg/m  Wt Readings from Last 3 Encounters:  07/09/18 187 lb (84.8 kg)  05/24/18 184 lb (83.5 kg)  05/04/18 190 lb (86.2 kg)    Physical Exam  Constitutional: He is oriented to person, place, and time. He appears well-developed and well-nourished. He is cooperative.  HENT:  Head: Normocephalic and atraumatic.  Eyes: EOM are normal.  Neck: Normal range of motion.  Cardiovascular: Normal rate, regular rhythm and normal heart sounds. Exam reveals no gallop and no friction rub.  No murmur heard. Pulmonary/Chest: Effort normal and breath sounds normal. No tachypnea. No respiratory distress. He has no decreased breath sounds. He has no wheezes. He has no rhonchi. He has no rales. He exhibits no tenderness.  Abdominal: Bowel sounds are normal.  Musculoskeletal: Normal range of motion. He exhibits tenderness. He exhibits no edema or deformity.       Lumbar back: He exhibits tenderness.       Back:  Neurological: He is alert and oriented to person, place, and time. Coordination normal.  Skin: Skin is warm and dry.  Psychiatric: He has a normal mood and affect. His behavior is normal. Judgment and thought content normal.  Nursing note and vitals reviewed.        Patient has been counseled extensively about nutrition and exercise as well as the importance of adherence with medications and regular follow-up. The patient was given clear instructions to go to ER or return to medical center if symptoms don't improve, worsen or new problems develop. The patient verbalized understanding.   Follow-up: Return in about 4 weeks (around 08/06/2018) for  BACK PAIN.   [name], FNP-BC [facility] and [place], [place]   07/09/2018, 10:41 AM

## 2018-07-24 MED FILL — AMLODIPINE BESYLATE 10 MG T: 10 | 30 days supply | Qty: 30 | Fill #2

## 2018-08-07 ENCOUNTER — Ambulatory Visit: Payer: Self-pay | Attending: Nurse Practitioner | Admitting: Nurse Practitioner

## 2018-08-07 ENCOUNTER — Encounter: Payer: Self-pay | Admitting: Nurse Practitioner

## 2018-08-07 VITALS — BP 149/99 | HR 71 | Temp 98.5°F | Ht 73.0 in | Wt 192.0 lb

## 2018-08-07 DIAGNOSIS — I10 Essential (primary) hypertension: Secondary | ICD-10-CM | POA: Insufficient documentation

## 2018-08-07 DIAGNOSIS — Z1211 Encounter for screening for malignant neoplasm of colon: Secondary | ICD-10-CM

## 2018-08-07 DIAGNOSIS — M549 Dorsalgia, unspecified: Secondary | ICD-10-CM | POA: Insufficient documentation

## 2018-08-07 MED ORDER — LISINOPRIL 20 MG PO TABS: 20.0000 mg | ORAL_TABLET | Freq: Every day | ORAL | 3 refills | Status: DC

## 2018-08-07 MED FILL — LISINOPRIL 20 MG TAB: 20 | 30 days supply | Qty: 30 | Fill #0

## 2018-08-07 NOTE — Progress Notes (Signed)
Assessment & Plan:  Erik Choi was seen today for back pain.  Diagnoses and all orders for this visit:  Essential hypertension -     CMP14+EGFR -     lisinopril (PRINIVIL,ZESTRIL) 20 MG tablet; Take 1 tablet (20 mg total) by mouth daily.  Colon cancer screening -     Fecal occult blood, imunochemical([facility])    Patient has been counseled on age-appropriate routine health concerns for screening and prevention. These are reviewed and up-to-date. Referrals have been placed accordingly. Immunizations are up-to-date or declined.    Subjective:   Chief Complaint  Patient presents with  . Back Pain    Pt. stated his back pain is better but think his blood pressure is not doing great with the medication. Patient stated his reading his around 140/90.   HPI Erik Choi 52 y.o. male presents to office today for HTN.   Essential Hypertension Monitoring blood pressure at home with average readings 140-150s/80-90s. Endorses medication compliance taking amlodipine 19m. Will add lisinopril 213mtoday. He denies chest pain, shortness of breath, palpitations, lightheadedness, dizziness, headaches or BLE edema.  BP Readings from Last 3 Encounters:  08/07/18 (!) 149/99  07/09/18 (!) 142/87  06/19/18 113/78    Review of Systems  Constitutional: Negative for fever, malaise/fatigue and weight loss.  HENT: Negative.  Negative for nosebleeds.   Eyes: Negative.  Negative for blurred vision, double vision and photophobia.  Respiratory: Negative.  Negative for cough and shortness of breath.   Cardiovascular: Negative.  Negative for chest pain, palpitations and leg swelling.  Gastrointestinal: Negative.  Negative for heartburn, nausea and vomiting.  Musculoskeletal: Positive for back pain. Negative for myalgias.  Neurological: Negative.  Negative for dizziness, focal weakness, seizures and headaches.  Psychiatric/Behavioral: Negative.  Negative for suicidal ideas.    Past Medical History:   Diagnosis Date  . Hypertension     History reviewed. No pertinent surgical history.  Family History  Problem Relation Age of Onset  . Cancer Neg Hx   . Diabetes Neg Hx     Social History Reviewed with no changes to be made today.   Outpatient Medications Prior to Visit  Medication Sig Dispense Refill  . amLODipine (NORVASC) 10 MG tablet Take 1 tablet (10 mg total) by mouth daily. 90 tablet 3  . cyclobenzaprine (FLEXERIL) 10 MG tablet Take 1 tablet (10 mg total) by mouth 3 (three) times daily as needed for muscle spasms. 60 tablet 1  . Multiple Vitamin (MULTIVITAMIN WITH MINERALS) TABS tablet Take 1 tablet by mouth daily.    . meloxicam (MOBIC) 7.5 MG tablet Take 1 tablet (7.5 mg total) by mouth daily. (Patient not taking: Reported on 08/07/2018) 30 tablet 1   No facility-administered medications prior to visit.     No Known Allergies     Objective:    BP (!) 149/99 (BP Location: Left Arm, Patient Position: Sitting, Cuff Size: Normal)   Pulse 71   Temp 98.5 F (36.9 C) (Oral)   Ht _0  (1.854 m)   Wt 192 lb (87.1 kg)   SpO2 97%   BMI 25.33 kg/m  Wt Readings from Last 3 Encounters:  08/07/18 192 lb (87.1 kg)  07/09/18 187 lb (84.8 kg)  05/24/18 184 lb (83.5 kg)    Physical Exam  Constitutional: He is oriented to person, place, and time. He appears well-developed and well-nourished. He is cooperative.  HENT:  Head: Normocephalic and atraumatic.  Eyes: EOM are normal.  Neck: Normal range of motion.  Cardiovascular: Normal rate, regular rhythm and normal heart sounds. Exam reveals no gallop and no friction rub.  No murmur heard. Pulmonary/Chest: Effort normal and breath sounds normal. No tachypnea. No respiratory distress. He has no decreased breath sounds. He has no wheezes. He has no rhonchi. He has no rales. He exhibits no tenderness.  Abdominal: Bowel sounds are normal.  Musculoskeletal: Normal range of motion. He exhibits no edema.  Neurological: He is alert  and oriented to person, place, and time. Coordination normal.  Skin: Skin is warm and dry.  Psychiatric: He has a normal mood and affect. His behavior is normal. Judgment and thought content normal.  Nursing note and vitals reviewed.      Patient has been counseled extensively about nutrition and exercise as well as the importance of adherence with medications and regular follow-up. The patient was given clear instructions to go to ER or return to medical center if symptoms don't improve, worsen or new problems develop. The patient verbalized understanding.   Follow-up: Return in about 3 weeks (around 08/28/2018) for BP recheck.   [name], FNP-BC [facility] and [place], [place]   08/09/2018, 12:31 AM

## 2018-08-08 LAB — CMP14+EGFR
A/G RATIO: 1.6 (ref 1.2–2.2)
ALT: 20 IU/L (ref 0–44)
AST: 19 IU/L (ref 0–40)
Albumin: 4.6 g/dL (ref 3.5–5.5)
Alkaline Phosphatase: 115 IU/L (ref 39–117)
BILIRUBIN TOTAL: 0.3 mg/dL (ref 0.0–1.2)
BUN/Creatinine Ratio: 12 (ref 9–20)
BUN: 13 mg/dL (ref 6–24)
CHLORIDE: 99 mmol/L (ref 96–106)
CO2: 25 mmol/L (ref 20–29)
Calcium: 9.4 mg/dL (ref 8.7–10.2)
Creatinine, Ser: 1.1 mg/dL (ref 0.76–1.27)
GFR calc non Af Amer: 77 mL/min/{1.73_m2} (ref >59)
GFR, EST AFRICAN AMERICAN: 89 mL/min/{1.73_m2} (ref >59)
GLUCOSE: 84 mg/dL (ref 65–99)
Globulin, Total: 2.8 g/dL (ref 1.5–4.5)
POTASSIUM: 4.6 mmol/L (ref 3.5–5.2)
Sodium: 140 mmol/L (ref 134–144)
TOTAL PROTEIN: 7.4 g/dL (ref 6.0–8.5)

## 2018-08-13 ENCOUNTER — Telehealth: Payer: Self-pay

## 2018-08-13 NOTE — Telephone Encounter (Signed)
-----   Message from [name], NP sent at 08/09/2018 12:35 AM EDT ----- All labs are normal at this time including glucose, sodium, potassium, kidney and liver function.

## 2018-08-13 NOTE — Telephone Encounter (Signed)
CMA spoke to patient to inform on results.  Pt. Verified DOB. Pt. Understood.  

## 2018-08-26 LAB — FECAL OCCULT BLOOD, IMMUNOCHEMICAL

## 2018-08-27 ENCOUNTER — Ambulatory Visit: Payer: Self-pay | Attending: Nurse Practitioner | Admitting: Pharmacist

## 2018-08-27 ENCOUNTER — Encounter: Payer: Self-pay | Admitting: Pharmacist

## 2018-08-27 VITALS — BP 158/97 | HR 67

## 2018-08-27 DIAGNOSIS — Z79899 Other long term (current) drug therapy: Secondary | ICD-10-CM | POA: Insufficient documentation

## 2018-08-27 DIAGNOSIS — I10 Essential (primary) hypertension: Secondary | ICD-10-CM | POA: Insufficient documentation

## 2018-08-27 MED FILL — AMLODIPINE BESYLATE 10 MG T: 10 | 30 days supply | Qty: 30 | Fill #3

## 2018-08-27 MED FILL — MELOXICAM 7.5 MG TABLET: 7.5 | 30 days supply | Qty: 30 | Fill #1

## 2018-08-27 NOTE — Patient Instructions (Addendum)
Thank you for coming to see [place] today.   Blood pressure today is elevated.   Start taking:  1. Lisinopril 20 mg daily in the morning.  2. Amlodipine 10 mg daily in the evening.   Continue to take:  1. Meloxicam 7.5 mg daily for back and neck pain.  2. Flexeril 10 mg up to 3 times daily for muscle spasms.   Limiting salt and caffeine, as well as exercising as able for at least 30 minutes for 5 days out of the week, can also help you lower your blood pressure.  Take your blood pressure at home if you are able. Please write down these numbers and bring them to your visits.  If you have any questions about medications, please call me [phone].  Lurena Joiner

## 2018-08-27 NOTE — Progress Notes (Signed)
   S:    PCP: [name]   Patient arrives in good spirits. Presents to the clinic for hypertension management. Patient was referred by Zelda on 08/07/18. BP at that visit 149/99. Lisinopril 20 mg added to regimen.  Today, pt denies chest pain, headache, shortness of breath, or blurred vision.    Patient denies adherence with medications.  Current BP Medications include:   - Amlodipine 10 mg. Denies taking - Lisinopril 20 mg   Antihypertensives tried in the past include:  - none  Dietary habits include: does not limit salt, drinks 3-4 cups of tea daily Exercise habits include: goes to the gym 4 times/week (1 hour on treadmill) Family / Social history: DM (father and sister), never smoker, denies alcohol  ASCVD risk: cannot calculate without lipid results  Home BP readings: 150s/90s  O:  L arm after 5 minutes rest 158/97, HR 67  Last 3 Office BP readings: BP Readings from Last 3 Encounters:  08/07/18 (!) 149/99  07/09/18 (!) 142/87  06/19/18 113/78   BMET    Component Value Date/Time   NA 140 08/07/2018 1601   K 4.6 08/07/2018 1601   CL 99 08/07/2018 1601   CO2 25 08/07/2018 1601   GLUCOSE 84 08/07/2018 1601   GLUCOSE 98 05/04/2018 2318   BUN 13 08/07/2018 1601   CREATININE 1.10 08/07/2018 1601   CALCIUM 9.4 08/07/2018 1601   GFRNONAA 77 08/07/2018 1601   GFRAA 89 08/07/2018 1601    Renal function: CrCl cannot be calculated (Unknown ideal weight.).  A/P: Hypertension longstanding currently uncontrolled on current medications. BP Goal = <130/80 mmHg. Patient is not adherent with current medications.  -Continued lisinopril 20 mg daily. -Reinforced adherence to amlodipine 10 mg daily. Pt to restart today.  -F/u labs ordered - lipid offered and declined; recommend lipid panel in the future.  -Counseled on lifestyle modifications for blood pressure control including reduced dietary sodium, increased exercise, adequate sleep  Results reviewed and written  information provided. Total time in face-to-face counseling 15 minutes.   F/U Clinic Visit in 3 weeks.    Patient seen with: Dixon Boos, PharmD Candidate [employer] 2021  Benard Halsted, PharmD, [place] [phone]

## 2018-08-29 ENCOUNTER — Telehealth: Payer: Self-pay

## 2018-08-29 NOTE — Telephone Encounter (Signed)
CMA spoke to patient to inform he need to stop by the lab to obtain another FECAL kit after his Pharmacist appt.  Pt. Verified DOB. Pt. Understood.

## 2018-08-29 NOTE — Telephone Encounter (Signed)
-----  Message from [name], NP sent at 08/27/2018  8:28 PM EST ----- Fecal sample needs to be resent. Please obtain another kit after you appointment with the pharmacist in a few weeks.

## 2018-09-04 MED FILL — LISINOPRIL 20 MG TAB: 20 | 30 days supply | Qty: 30 | Fill #1

## 2018-09-11 ENCOUNTER — Ambulatory Visit: Payer: Self-pay | Attending: Nurse Practitioner | Admitting: Pharmacist

## 2018-09-11 VITALS — BP 130/91 | HR 69

## 2018-09-11 DIAGNOSIS — Z1211 Encounter for screening for malignant neoplasm of colon: Secondary | ICD-10-CM | POA: Insufficient documentation

## 2018-09-11 DIAGNOSIS — I10 Essential (primary) hypertension: Secondary | ICD-10-CM

## 2018-09-11 NOTE — Progress Notes (Signed)
   S:    PCP: [name]   Patient arrives in in good spirits. Presents to the clinic for hypertension management. Patient was referred by Zelda on 08/07/18. I last saw him 08/27/18. BP was 158/97 but pt reported adherence only to his lisinopril. Encouraged adherence to both lisinopril and amlodipine.   Today, pt denies chest pain, headache, shortness of breath, or blurred vision.    Patient denies adherence with medications.  Current BP Medications include:   - Amlodipine 10 mg.  - Lisinopril 20 mg. Stopped taking due to feeling "ill and tired"  Antihypertensives tried in the past include:  - none  Dietary habits include: does not limit salt, drinks 3-4 cups of tea daily Exercise habits include: reports not going to the gym the past 2 weeks  Family / Social history: DM (father and sister), never smoker, denies alcohol  ASCVD risk: cannot calculate without lipid results  Home BP readings: Reports 110s-120s SBPs   O:  L arm after 5 minutes rest 130/91, HR 69  Last 3 Office BP readings: BP Readings from Last 3 Encounters:  08/27/18 (!) 158/97  08/07/18 (!) 149/99  07/09/18 (!) 142/87   BMET    Component Value Date/Time   NA 140 08/07/2018 1601   K 4.6 08/07/2018 1601   CL 99 08/07/2018 1601   CO2 25 08/07/2018 1601   GLUCOSE 84 08/07/2018 1601   GLUCOSE 98 05/04/2018 2318   BUN 13 08/07/2018 1601   CREATININE 1.10 08/07/2018 1601   CALCIUM 9.4 08/07/2018 1601   GFRNONAA 77 08/07/2018 1601   GFRAA 89 08/07/2018 1601    Renal function: CrCl cannot be calculated (Patient's most recent lab result is older than the maximum 21 days allowed.).  A/P: Hypertension longstanding currently uncontrolled on current medications. BP Goal = <130/80 mmHg. Patient is not adherent with current medications. He reports goal BP at home and states that he wants to limit salt before adding another medication. Will stop lisinopril and continue with amlodipine only.  -Stop lisinopril 20  mg daily. -Continue amlodipine 10 mg daily.   -F/u labs ordered - lipid offered and declined; recommend lipid panel in the future.  -HM: flu, tetanus due; pt declined -Counseled on lifestyle modifications for blood pressure control including reduced dietary sodium, increased exercise, adequate sleep  Results reviewed and written information provided. Total time in face-to-face counseling 15 minutes.   F/U Clinic Visit 10/15/2018 with PCP.  Benard Halsted, PharmD, [place] [phone]

## 2018-09-11 NOTE — Patient Instructions (Signed)
Thank you for coming to see [place] today.   Blood pressure today is improving.  Continue taking amlodipine but stop lisinopril.   Limiting salt and caffeine, as well as exercising as able for at least 30 minutes for 5 days out of the week, can also help you lower your blood pressure.  Take your blood pressure at home if you are able. Please write down these numbers and bring them to your visits.  If you have any questions about medications, please call me [phone].  Lurena Joiner

## 2018-09-12 ENCOUNTER — Encounter: Payer: Self-pay | Admitting: Pharmacist

## 2018-09-17 ENCOUNTER — Encounter: Payer: Self-pay | Admitting: Pharmacist

## 2018-10-08 MED FILL — AMLODIPINE BESYLATE 10 MG T: 10 | 30 days supply | Qty: 30 | Fill #4

## 2018-10-09 LAB — FECAL OCCULT BLOOD, IMMUNOCHEMICAL

## 2018-10-15 ENCOUNTER — Other Ambulatory Visit: Payer: Self-pay

## 2018-10-15 ENCOUNTER — Encounter: Payer: Self-pay | Admitting: Nurse Practitioner

## 2018-10-15 ENCOUNTER — Ambulatory Visit: Payer: Self-pay | Attending: Nurse Practitioner | Admitting: Nurse Practitioner

## 2018-10-15 VITALS — BP 156/88 | HR 87 | Temp 98.1°F | Ht 73.0 in | Wt 190.0 lb

## 2018-10-15 DIAGNOSIS — N529 Male erectile dysfunction, unspecified: Secondary | ICD-10-CM | POA: Insufficient documentation

## 2018-10-15 DIAGNOSIS — N521 Erectile dysfunction due to diseases classified elsewhere: Secondary | ICD-10-CM | POA: Insufficient documentation

## 2018-10-15 DIAGNOSIS — Z1211 Encounter for screening for malignant neoplasm of colon: Secondary | ICD-10-CM | POA: Insufficient documentation

## 2018-10-15 DIAGNOSIS — Z791 Long term (current) use of non-steroidal anti-inflammatories (NSAID): Secondary | ICD-10-CM | POA: Insufficient documentation

## 2018-10-15 DIAGNOSIS — Z91128 Patient's intentional underdosing of medication regimen for other reason: Secondary | ICD-10-CM | POA: Insufficient documentation

## 2018-10-15 DIAGNOSIS — R195 Other fecal abnormalities: Secondary | ICD-10-CM | POA: Insufficient documentation

## 2018-10-15 DIAGNOSIS — Z79899 Other long term (current) drug therapy: Secondary | ICD-10-CM | POA: Insufficient documentation

## 2018-10-15 DIAGNOSIS — I10 Essential (primary) hypertension: Secondary | ICD-10-CM | POA: Insufficient documentation

## 2018-10-15 DIAGNOSIS — T464X6A Underdosing of angiotensin-converting-enzyme inhibitors, initial encounter: Secondary | ICD-10-CM | POA: Insufficient documentation

## 2018-10-15 DIAGNOSIS — R42 Dizziness and giddiness: Secondary | ICD-10-CM | POA: Insufficient documentation

## 2018-10-15 MED ORDER — SILDENAFIL CITRATE 100 MG PO TABS: 50.0000 mg | ORAL_TABLET | Freq: Every day | ORAL | 2 refills | Status: DC | PRN

## 2018-10-15 MED ORDER — HYDROCHLOROTHIAZIDE 12.5 MG PO TABS: 12.5000 mg | ORAL_TABLET | Freq: Every day | ORAL | 1 refills | Status: DC

## 2018-10-15 MED FILL — SILDENAFIL CITRATE 100 MG T: 100 | 30 days supply | Qty: 10 | Fill #0

## 2018-10-15 MED FILL — HYDROCHLOROTHIAZIDE 12.5 MG: 12.5 | 30 days supply | Qty: 30 | Fill #0

## 2018-10-15 NOTE — Progress Notes (Signed)
Assessment & Plan:  Erik Choi was seen today for hypertension.  Diagnoses and all orders for this visit:  Essential hypertension -     hydrochlorothiazide (HYDRODIURIL) 12.5 MG tablet; Take 1 tablet (12.5 mg total) by mouth daily. Continue all antihypertensives as prescribed.  Remember to bring in your blood pressure log with you for your follow up appointment.  DASH/Mediterranean Diets are healthier choices for HTN.  Follow-up in a few weeks for blood pressure recheck.  Erectile dysfunction due to diseases classified elsewhere -     sildenafil (VIAGRA) 100 MG tablet; Take 0.5-1 tablets (50-100 mg total) by mouth daily as needed for erectile dysfunction.  Colon cancer screening -     Fecal occult blood, imunochemical([facility])    Patient has been counseled on age-appropriate routine health concerns for screening and prevention. These are reviewed and up-to-date. Referrals have been placed accordingly. Immunizations are up-to-date or declined.    Subjective:   Chief Complaint  Patient presents with  . Hypertension   HPI Erik Choi 53 y.o. male presents to office today for follow up to HTN. Blood pressure is elevated today.   ESSENTIAL HYPERTENSION He does monitor his blood pressure at home with average readings 130-140/80s.  Previous antihypertensive regimen included amlodipine 10 mg and lisinopril 20 mg daily however he endorsed dizziness with taking lisinopril so it was stopped.  Thiazide 12.5 mg to his current regimen of amlodipine 10 mg daily. Denies chest pain, shortness of breath, palpitations, lightheadedness, dizziness, headaches or BLE edema.  It is also instructed to minimize any excessive use of NSAIDs including meloxicam. BP Readings from Last 3 Encounters:  10/15/18 (!) 156/88  09/11/18 (!) 130/91  08/27/18 (!) 158/97    Erectile Dysfunction: Patient complains of erectile dysfunction.  Onset of dysfunction was several months ago and was gradual in onset  with antihypertensive use.  Patient states the nature of difficulty is both attaining and maintaining erection. Full erections occur on awakening. Partial erections occur with intercourse. Libido is not affected. Risk factors for ED include cardiovascular disease and antihypertensive medications, see above. Patient denies history of cranial, spinal, or pelvic trauma. Patient's expectations as to sexual function are the ability to maintain and achieve an erection.  Patient's description of relationship w/partner GOOD.  Previous treatment of ED includes NONE.    Review of Systems  Constitutional: Negative for fever, malaise/fatigue and weight loss.  HENT: Negative.  Negative for nosebleeds.   Eyes: Negative.  Negative for blurred vision, double vision and photophobia.  Respiratory: Negative.  Negative for cough and shortness of breath.   Cardiovascular: Negative.  Negative for chest pain, palpitations and leg swelling.  Gastrointestinal: Negative.  Negative for heartburn, nausea and vomiting.  Genitourinary:       See HPI  Musculoskeletal: Negative.  Negative for myalgias.  Neurological: Negative.  Negative for dizziness, focal weakness, seizures and headaches.  Psychiatric/Behavioral: Negative.  Negative for suicidal ideas.    Past Medical History:  Diagnosis Date  . Hypertension     History reviewed. No pertinent surgical history.  Family History  Problem Relation Age of Onset  . Cancer Neg Hx   . Diabetes Neg Hx     Social History Reviewed with no changes to be made today.   Outpatient Medications Prior to Visit  Medication Sig Dispense Refill  . amLODipine (NORVASC) 10 MG tablet Take 1 tablet (10 mg total) by mouth daily. 90 tablet 3  . meloxicam (MOBIC) 7.5 MG tablet Take 1 tablet (7.5 mg  total) by mouth daily. 30 tablet 1  . Multiple Vitamin (MULTIVITAMIN WITH MINERALS) TABS tablet Take 1 tablet by mouth daily.    Marland Kitchen lisinopril (PRINIVIL,ZESTRIL) 20 MG tablet Take 1 tablet (20  mg total) by mouth daily. (Patient not taking: Reported on 10/15/2018) 90 tablet 3   No facility-administered medications prior to visit.     No Known Allergies     Objective:    BP (!) 156/88   Pulse 87   Temp 98.1 F (36.7 C) (Oral)   Ht 6\' 1"  (1.854 m)   Wt 190 lb (86.2 kg)   SpO2 97%   BMI 25.07 kg/m  Wt Readings from Last 3 Encounters:  10/15/18 190 lb (86.2 kg)  08/07/18 192 lb (87.1 kg)  07/09/18 187 lb (84.8 kg)    Physical Exam Vitals signs and nursing note reviewed.  Constitutional:      Appearance: He is well-developed.  HENT:     Head: Normocephalic and atraumatic.  Neck:     Musculoskeletal: Normal range of motion.  Cardiovascular:     Rate and Rhythm: Normal rate and regular rhythm.     Heart sounds: Normal heart sounds. No murmur. No friction rub. No gallop.   Pulmonary:     Effort: Pulmonary effort is normal. No tachypnea or respiratory distress.     Breath sounds: Normal breath sounds. No decreased breath sounds, wheezing, rhonchi or rales.  Chest:     Chest wall: No tenderness.  Abdominal:     General: Bowel sounds are normal.     Palpations: Abdomen is soft.  Musculoskeletal: Normal range of motion.  Skin:    General: Skin is warm and dry.  Neurological:     Mental Status: He is alert and oriented to person, place, and time.     Coordination: Coordination normal.  Psychiatric:        Behavior: Behavior normal. Behavior is cooperative.        Thought Content: Thought content normal.        Judgment: Judgment normal.          Patient has been counseled extensively about nutrition and exercise as well as the importance of adherence with medications and regular follow-up. The patient was given clear instructions to go to ER or return to medical center if symptoms don't improve, worsen or new problems develop. The patient verbalized understanding.   Follow-up: Return in about 3 weeks (around 11/05/2018) for BP recheck with LUKE.   [name], FNP-BC [facility] and [place], [place]   10/15/2018, 12:19 PM

## 2018-10-17 ENCOUNTER — Telehealth (INDEPENDENT_AMBULATORY_CARE_PROVIDER_SITE_OTHER): Payer: Self-pay

## 2018-10-17 NOTE — Telephone Encounter (Signed)
Patient verified DOB. He is aware that he needs to pick up another fecal kit as test was not able to be done due to age of specimen. Patient stated he would come by the clinic on Monday 10/22/2018 to pick up, as he is currently out of town. Tempestt S Roberts, CMA  

## 2018-10-17 NOTE — Telephone Encounter (Signed)
-----   Message from [name], NP sent at 10/10/2018  8:56 PM EST ----- Fecal test will need to be repeated due to age of specimen

## 2018-10-21 LAB — FECAL OCCULT BLOOD, IMMUNOCHEMICAL: Fecal Occult Bld: NEGATIVE

## 2018-10-23 ENCOUNTER — Telehealth (INDEPENDENT_AMBULATORY_CARE_PROVIDER_SITE_OTHER): Payer: Self-pay

## 2018-10-23 NOTE — Telephone Encounter (Signed)
Patient is aware that FIT is negative for blood, repeat in one year. Nat Christen, CMA

## 2018-10-23 NOTE — Telephone Encounter (Signed)
-----   Message from [name], NP sent at 10/21/2018  7:10 PM EST ----- Stool study is negative for blood. Will repeat next year

## 2018-11-06 ENCOUNTER — Encounter: Payer: Self-pay | Admitting: Pharmacist

## 2018-11-12 MED FILL — AMLODIPINE BESYLATE 10 MG T: 10 | 30 days supply | Qty: 30 | Fill #5

## 2018-11-12 MED FILL — CYCLOBENZAPRINE 10 MG TAB: 10 | 20 days supply | Qty: 60 | Fill #1

## 2018-11-12 MED FILL — HYDROCHLOROTHIAZIDE 12.5 MG: 12.5 | 30 days supply | Qty: 30 | Fill #1

## 2018-11-20 ENCOUNTER — Encounter: Payer: Self-pay | Admitting: Pharmacist

## 2018-11-20 ENCOUNTER — Ambulatory Visit: Payer: Self-pay | Attending: Family Medicine | Admitting: Pharmacist

## 2018-11-20 VITALS — BP 143/93 | HR 76

## 2018-11-20 DIAGNOSIS — Z79899 Other long term (current) drug therapy: Secondary | ICD-10-CM | POA: Insufficient documentation

## 2018-11-20 DIAGNOSIS — Z833 Family history of diabetes mellitus: Secondary | ICD-10-CM | POA: Insufficient documentation

## 2018-11-20 DIAGNOSIS — I10 Essential (primary) hypertension: Secondary | ICD-10-CM | POA: Insufficient documentation

## 2018-11-20 MED ORDER — HYDROCHLOROTHIAZIDE 25 MG PO TABS: 25.0000 mg | ORAL_TABLET | Freq: Every day | ORAL | 2 refills | Status: DC

## 2018-11-20 MED FILL — HYDROCHLOROTHIAZIDE 25 MG T: 25 | 30 days supply | Qty: 30 | Fill #0

## 2018-11-20 NOTE — Patient Instructions (Signed)
Thank you for coming to see [place] today.   Blood pressure today is elevated.  Increase HCTZ to 25 mg in the morning. Continue amlodipine 10 mg daily in the evening.   Limiting salt and caffeine, as well as exercising as able for at least 30 minutes for 5 days out of the week, can also help you lower your blood pressure.  Take your blood pressure at home if you are able. Please write down these numbers and bring them to your visits.  If you have any questions about medications, please call me [phone].  Lurena Joiner

## 2018-11-20 NOTE — Progress Notes (Signed)
   S:    PCP: [name]   Patient arrives in in good spirits. Presents to the clinic for hypertension management. Patient was referred by Zelda on 10/15/18. BP at that visit 156/88 - HCTZ added to regimen.   Today, pt denies chest pain, headache, shortness of breath, or blurred vision.    Patient reports adherence with medications.  Current BP Medications include:   - Amlodipine 10 mg.  - HCTZ 12.5 mg daily  Antihypertensives tried in the past include:  - none  Dietary habits include: limits salt, drinks 3-4 cups of tea daily Exercise habits include: reports not going to the gym the past 2 weeks  Family / Social history: DM (father and sister), never smoker, denies alcohol  ASCVD risk: cannot calculate without lipid results  Home BP readings: 130s SBPs  O:  L arm after 5 minutes rest 143/93, HR 76  Last 3 Office BP readings: BP Readings from Last 3 Encounters:  11/20/18 (!) 143/93  10/15/18 (!) 156/88  09/11/18 (!) 130/91   BMET    Component Value Date/Time   NA 140 08/07/2018 1601   K 4.6 08/07/2018 1601   CL 99 08/07/2018 1601   CO2 25 08/07/2018 1601   GLUCOSE 84 08/07/2018 1601   GLUCOSE 98 05/04/2018 2318   BUN 13 08/07/2018 1601   CREATININE 1.10 08/07/2018 1601   CALCIUM 9.4 08/07/2018 1601   GFRNONAA 77 08/07/2018 1601   GFRAA 89 08/07/2018 1601   Renal function: CrCl cannot be calculated (Patient's most recent lab result is older than the maximum 21 days allowed.).  A/P: Hypertension longstanding currently uncontrolled on current medications. BP Goal = <130/80 mmHg. Patient is adherent with current medications. Will increase HCTZ to 25 mg daily.  -Continue amlodipine 10 mg daily.   -Increased HCTZ to 25 mg daily.  -HM: flu, tetanus due; pt declined -Counseled on lifestyle modifications for blood pressure control including reduced dietary sodium, increased exercise, adequate sleep  Results reviewed and written information provided. Total time in  face-to-face counseling 15 minutes.   F/U Clinic Visit 10/15/2018 with PCP.  Benard Halsted, PharmD, [place] [phone]

## 2018-11-26 MED FILL — SILDENAFIL CITRATE 100 MG T: 100 | 30 days supply | Qty: 10 | Fill #1

## 2018-12-19 ENCOUNTER — Ambulatory Visit: Payer: Self-pay | Attending: Nurse Practitioner | Admitting: Nurse Practitioner

## 2018-12-19 ENCOUNTER — Encounter: Payer: Self-pay | Admitting: Nurse Practitioner

## 2018-12-19 ENCOUNTER — Other Ambulatory Visit: Payer: Self-pay

## 2018-12-19 VITALS — BP 139/81 | HR 66 | Temp 98.5°F | Ht 73.0 in | Wt 196.0 lb

## 2018-12-19 DIAGNOSIS — I10 Essential (primary) hypertension: Secondary | ICD-10-CM

## 2018-12-19 DIAGNOSIS — G44209 Tension-type headache, unspecified, not intractable: Secondary | ICD-10-CM

## 2018-12-19 DIAGNOSIS — G8929 Other chronic pain: Secondary | ICD-10-CM

## 2018-12-19 DIAGNOSIS — M79642 Pain in left hand: Secondary | ICD-10-CM

## 2018-12-19 MED ORDER — LOSARTAN POTASSIUM 25 MG PO TABS: 25.0000 mg | ORAL_TABLET | Freq: Every day | ORAL | 1 refills | Status: DC

## 2018-12-19 MED ORDER — AMLODIPINE BESYLATE 10 MG PO TABS: 10.0000 mg | ORAL_TABLET | Freq: Every day | ORAL | 3 refills | Status: DC

## 2018-12-19 MED FILL — LOSARTAN POTASSIUM 25 MG TA: 25 | 30 days supply | Qty: 30 | Fill #0

## 2018-12-19 MED FILL — AMLODIPINE BESYLATE 10 MG T: 10 | 30 days supply | Qty: 30 | Fill #0

## 2018-12-19 NOTE — Progress Notes (Signed)
Orange was seen today for follow-up.  Diagnoses and all orders for this visit:  Essential hypertension -     amLODipine (NORVASC) 10 MG tablet; Take 1 tablet (10 mg total) by mouth daily. -     losartan (COZAAR) 25 MG tablet; Take 1 tablet (25 mg total) by mouth daily for 30 days. -     CMP14+EGFR Continue all antihypertensives as prescribed.  Remember to bring in your blood pressure log with you for your follow up appointment.  DASH/Mediterranean Diets are healthier choices for HTN.   Acute non intractable tension-type headache -     losartan (COZAAR) 25 MG tablet; Take 1 tablet (25 mg total) by mouth daily for 30 days. HCTZ dc'd.    Chronic hand pain, left Recommended splinting of left hand He has full ROM, there is no joint deformity, swelling or signs of infection    Patient has been counseled on age-appropriate routine health concerns for screening and prevention. These are reviewed and up-to-date. Referrals have been placed accordingly. Immunizations are up-to-date or declined.    Subjective:   Chief Complaint  Patient presents with  . Follow-up    Pt. is here for Hypertension follow-up.    HPI Erik Choi 53 y.o. male presents to office today for follow-up to essential hypertension.  He also has complaints of mild frontal headache and left hand pain.   ESSENTIAL HYPERTENSION Chronic and controlled.  Monitoring his blood pressure at home with average readings  120-130/80s.  He is currently taking amlodipine 10 mg daily and hydrochlorothiazide 25 daily.  However he endorses mild frontal headaches with taking hydrochlorothiazide.  Will discontinue hydrochlorothiazide and start patient on losartan 25 mg daily at this time.  Currently denies any chest pain, shortness of breath, bilateral lower extremity edema, visual disturbances, palpitations, lightheadedness or dizziness. BP Readings from Last 3 Encounters:  12/19/18 139/81  11/20/18 (!) 143/93  10/15/18 (!) 156/88     HEADACHES Ongoing over the past few months with increase in hydrochlorothiazide from 12.5 to 25 mg.  Endorses mild frontal headaches and "feeling foggy". No visual disturbances, nausea or vomiting.  Pain 3-4/10.  He has not tried anything over-the-counter for his headaches.   Hand Pain: Patient complains of arthralgias for which has been present for 2 months. Pain is located in left lateral hand pain, is described as aching, and is intermittent, mild .  Associated symptoms include: none.  The patient has tried nothing for pain relief.  Related to injury:   Yes; 2 months ago he was trying to push a box in to a cargo van and  hyperextended his pinky finger. Aggravating factors: hyperextending his pinky or activelty pressing on the carpal joint below the pinky. Denies any swelling, joint deformity, bruising or warmth. Pain 6-7/10  Review of Systems  Constitutional: Negative for fever, malaise/fatigue and weight loss.  HENT: Negative.  Negative for nosebleeds.   Eyes: Negative.  Negative for blurred vision, double vision and photophobia.  Respiratory: Negative.  Negative for cough and shortness of breath.   Cardiovascular: Negative.  Negative for chest pain, palpitations and leg swelling.  Gastrointestinal: Negative.  Negative for heartburn, nausea and vomiting.  Musculoskeletal: Positive for joint pain. Negative for myalgias.  Neurological: Positive for headaches. Negative for dizziness, sensory change, speech change, focal weakness and seizures.  Psychiatric/Behavioral: Negative.  Negative for suicidal ideas.    Past Medical History:  Diagnosis Date  . Hypertension     History reviewed. No pertinent surgical history.  Family  History  Problem Relation Age of Onset  . Cancer Neg Hx   . Diabetes Neg Hx     Social History Reviewed with no changes to be made today.   Outpatient Medications Prior to Visit  Medication Sig Dispense Refill  . Multiple Vitamin (MULTIVITAMIN WITH MINERALS)  TABS tablet Take 1 tablet by mouth daily.    . sildenafil (VIAGRA) 100 MG tablet Take 0.5-1 tablets (50-100 mg total) by mouth daily as needed for erectile dysfunction. 10 tablet 2  . amLODipine (NORVASC) 10 MG tablet Take 1 tablet (10 mg total) by mouth daily. 90 tablet 3  . hydrochlorothiazide (HYDRODIURIL) 25 MG tablet Take 1 tablet (25 mg total) by mouth daily. 30 tablet 2  . meloxicam (MOBIC) 7.5 MG tablet Take 1 tablet (7.5 mg total) by mouth daily. (Patient not taking: Reported on 12/19/2018) 30 tablet 1   No facility-administered medications prior to visit.     Allergies  Allergen Reactions  . Lisinopril        Objective:    BP 139/81 (BP Location: Right Arm, Patient Position: Sitting, Cuff Size: Normal)   Pulse 66   Temp 98.5 F (36.9 C) (Oral)   Ht _0  (1.854 m)   Wt 196 lb (88.9 kg)   SpO2 98%   BMI 25.86 kg/m  Wt Readings from Last 3 Encounters:  12/19/18 196 lb (88.9 kg)  10/15/18 190 lb (86.2 kg)  08/07/18 192 lb (87.1 kg)    Physical Exam Vitals signs and nursing note reviewed.  Constitutional:      Appearance: He is well-developed.  HENT:     Head: Normocephalic and atraumatic.  Neck:     Musculoskeletal: Normal range of motion.  Cardiovascular:     Rate and Rhythm: Normal rate and regular rhythm.     Heart sounds: Normal heart sounds. No murmur. No friction rub. No gallop.   Pulmonary:     Effort: Pulmonary effort is normal. No tachypnea or respiratory distress.     Breath sounds: Normal breath sounds. No decreased breath sounds, wheezing, rhonchi or rales.  Chest:     Chest wall: No tenderness.  Abdominal:     General: Bowel sounds are normal.     Palpations: Abdomen is soft.  Musculoskeletal: Normal range of motion.     Left hand: He exhibits tenderness (endorsed with moderate palpation of carpal joint). He exhibits normal range of motion, normal capillary refill, no deformity, no laceration and no swelling. Normal sensation noted.       Hands:   Skin:    General: Skin is warm and dry.  Neurological:     Mental Status: He is alert and oriented to person, place, and time.     Cranial Nerves: Cranial nerves are intact.     Sensory: Sensation is intact.     Motor: Motor function is intact.     Coordination: Coordination is intact. Coordination normal.  Psychiatric:        Behavior: Behavior normal. Behavior is cooperative.        Thought Content: Thought content normal.        Judgment: Judgment normal.        Patient has been counseled extensively about nutrition and exercise as well as the importance of adherence with medications and regular follow-up. The patient was given clear instructions to go to ER or return to medical center if symptoms don't improve, worsen or new problems develop. The patient verbalized understanding.   Follow-up: Return for  BP Recheck on april 6th.   [name], FNP-BC [facility] and [place], [place]   12/19/2018, 10:35 PM

## 2018-12-20 LAB — CMP14+EGFR
ALK PHOS: 117 IU/L (ref 39–117)
ALT: 17 IU/L (ref 0–44)
AST: 21 IU/L (ref 0–40)
Albumin/Globulin Ratio: 1.6 (ref 1.2–2.2)
Albumin: 4.7 g/dL (ref 3.8–4.9)
BILIRUBIN TOTAL: 0.3 mg/dL (ref 0.0–1.2)
BUN/Creatinine Ratio: 13 (ref 9–20)
BUN: 16 mg/dL (ref 6–24)
CO2: 26 mmol/L (ref 20–29)
Calcium: 10.1 mg/dL (ref 8.7–10.2)
Chloride: 96 mmol/L (ref 96–106)
Creatinine, Ser: 1.23 mg/dL (ref 0.76–1.27)
GFR calc Af Amer: 78 mL/min/{1.73_m2} (ref >59)
GFR calc non Af Amer: 68 mL/min/{1.73_m2} (ref >59)
Globulin, Total: 3 g/dL (ref 1.5–4.5)
Glucose: 90 mg/dL (ref 65–99)
Potassium: 3.9 mmol/L (ref 3.5–5.2)
Sodium: 139 mmol/L (ref 134–144)
Total Protein: 7.7 g/dL (ref 6.0–8.5)

## 2018-12-21 ENCOUNTER — Telehealth: Payer: Self-pay

## 2018-12-21 NOTE — Telephone Encounter (Signed)
CMA attempt to reach patient to inform on results.  No answer and left a VM.  

## 2018-12-21 NOTE — Telephone Encounter (Signed)
CMA spoke to patient to inform on results.  Pt. Understood. Pt. Verified DOB.

## 2018-12-21 NOTE — Telephone Encounter (Signed)
-----   Message from [name], NP sent at 12/21/2018  6:43 AM EDT ----- Kidney and liver function are normal

## 2019-01-04 MED FILL — SILDENAFIL CITRATE 100 MG T: 100 | 30 days supply | Qty: 10 | Fill #2

## 2019-01-12 MED FILL — LOSARTAN POTASSIUM 25 MG TA: 25 | 30 days supply | Qty: 30 | Fill #1

## 2019-01-14 ENCOUNTER — Encounter: Payer: Self-pay | Admitting: Nurse Practitioner

## 2019-01-22 MED FILL — AMLODIPINE BESYLATE 10 MG T: 10 | 30 days supply | Qty: 30 | Fill #1

## 2019-02-07 ENCOUNTER — Other Ambulatory Visit: Payer: Self-pay | Admitting: Nurse Practitioner

## 2019-02-07 ENCOUNTER — Telehealth: Payer: Self-pay | Admitting: Nurse Practitioner

## 2019-02-07 DIAGNOSIS — I10 Essential (primary) hypertension: Secondary | ICD-10-CM

## 2019-02-07 DIAGNOSIS — N521 Erectile dysfunction due to diseases classified elsewhere: Secondary | ICD-10-CM

## 2019-02-07 NOTE — Telephone Encounter (Signed)
1) Medication(s) Requested (by name): viagra Losartan 2) Pharmacy of Choice: [facility] 3) Special Requests:   Approved medications will be sent to the pharmacy, we will reach out if there is an issue.  Requests made after 3pm may not be addressed until the following business day!  If a patient is unsure of the name of the medication(s) please note and ask patient to call back when they are able to provide all info, do not send to responsible party until all information is available!

## 2019-02-08 MED FILL — LOSARTAN POTASSIUM 25 MG TA: 25 | 30 days supply | Qty: 30 | Fill #0

## 2019-02-08 MED FILL — SILDENAFIL CITRATE 100 MG T: 100 | 30 days supply | Qty: 10 | Fill #0

## 2019-02-08 NOTE — Telephone Encounter (Signed)
Duplicate request

## 2019-02-13 ENCOUNTER — Other Ambulatory Visit: Payer: Self-pay

## 2019-02-13 ENCOUNTER — Encounter: Payer: Self-pay | Admitting: Nurse Practitioner

## 2019-02-13 ENCOUNTER — Ambulatory Visit: Payer: Self-pay | Attending: Nurse Practitioner | Admitting: Nurse Practitioner

## 2019-02-13 DIAGNOSIS — N529 Male erectile dysfunction, unspecified: Secondary | ICD-10-CM

## 2019-02-13 DIAGNOSIS — I10 Essential (primary) hypertension: Secondary | ICD-10-CM

## 2019-02-13 MED ORDER — TADALAFIL 10 MG PO TABS: 10.0000 mg | ORAL_TABLET | Freq: Every day | ORAL | 1 refills | Status: DC | PRN

## 2019-02-13 MED FILL — TADALAFIL 10 MG TABS: 10 | 15 days supply | Qty: 5 | Fill #0

## 2019-02-13 NOTE — Progress Notes (Signed)
Virtual Visit via Telephone Note Due to national recommendations of social distancing due to [place] 19, telehealth visit is felt to be most appropriate for this patient at this time.  I discussed the limitations, risks, security and privacy concerns of performing an evaluation and management service by telephone and the availability of in person appointments. I also discussed with the patient that there may be a patient responsible charge related to this service. The patient expressed understanding and agreed to proceed.    I connected with Erik Choi on 02/13/19  at   3:10 PM EDT  EDT by telephone and verified that I am speaking with the correct person using two identifiers.   Consent I discussed the limitations, risks, security and privacy concerns of performing an evaluation and management service by telephone and the availability of in person appointments. I also discussed with the patient that there may be a patient responsible charge related to this service. The patient expressed understanding and agreed to proceed.   Location of Patient: Private Residence    Location of Provider: [place] participating in Telemedicine visit: [name] FNP-BC [place]    History of Present Illness: Telemedicine visit for: Essential Hypertension Past Medical History:  Diagnosis Date  . Erectile dysfunction   . Hypertension    Essential Hypertension Well controlled. Average home readings: 110-130/80s. Monitoring his blood pressure twice a day. Denies chest pain, shortness of breath, palpitations, lightheadedness, dizziness, headaches or BLE edema. Currently taking amlodipine 10 mg and Losartan 25mg .  BP Readings from Last 3 Encounters:  12/19/18 139/81  11/20/18 (!) 143/93  10/15/18 (!) 156/88   Erectile Dysfunction Patient complains of erectile dysfunction. Has recently tried Viagra with no success.  Onset of dysfunction was  several months ago and was gradual in onset with antihypertensive use.  Patient states the nature of difficulty is both attaining and maintaining erection. Full erections occur on awakening. Partial erections occur with intercourse. Libido is not affected. Risk factors for ED include cardiovascular disease and antihypertensive medications, see above. Patient denies history of cranial, spinal, or pelvic trauma. Patient's expectations as to sexual function are the ability to maintain and achieve an erection.  Patient's description of relationship w/partner GOOD.  Previous treatment of ED includes: VIAGRA; see above.     History reviewed. No pertinent surgical history.  Family History  Problem Relation Age of Onset  . Cancer Neg Hx   . Diabetes Neg Hx     Social History   Socioeconomic History  . Marital status: Married    Spouse name: Not on file  . Number of children: Not on file  . Years of education: Not on file  . Highest education level: Not on file  Occupational History  . Not on file  Social Needs  . Financial resource strain: Not on file  . Food insecurity:    Worry: Not on file    Inability: Not on file  . Transportation needs:    Medical: Not on file    Non-medical: Not on file  Tobacco Use  . Smoking status: Never Smoker  . Smokeless tobacco: Never Used  Substance and Sexual Activity  . Alcohol use: Never    Frequency: Never  . Drug use: Never  . Sexual activity: Yes  Lifestyle  . Physical activity:    Days per week: Not on file    Minutes per session: Not on file  . Stress: Not on  file  Relationships  . Social connections:    Talks on phone: Not on file    Gets together: Not on file    Attends religious service: Not on file    Active member of club or organization: Not on file    Attends meetings of clubs or organizations: Not on file    Relationship status: Not on file  Other Topics Concern  . Not on file  Social History Narrative  . Not on file      Observations/Objective: Awake, alert and oriented x 3   Review of Systems  Constitutional: Negative for fever, malaise/fatigue and weight loss.  HENT: Negative.  Negative for nosebleeds.   Eyes: Negative.  Negative for blurred vision, double vision and photophobia.  Respiratory: Negative.  Negative for cough and shortness of breath.   Cardiovascular: Negative.  Negative for chest pain, palpitations and leg swelling.  Gastrointestinal: Negative.  Negative for heartburn, nausea and vomiting.  Genitourinary:       SEE HPI  Musculoskeletal: Negative.  Negative for myalgias.  Neurological: Negative.  Negative for dizziness, focal weakness, seizures and headaches.  Psychiatric/Behavioral: Negative.  Negative for suicidal ideas.    Assessment and Plan: Diagnoses and all orders for this visit:  Essential hypertension -     Basic metabolic panel; Future Continue all antihypertensives as prescribed.  Remember to bring in your blood pressure log with you for your follow up appointment.  DASH/Mediterranean Diets are healthier choices for HTN.   Erectile dysfunction, unspecified erectile dysfunction type -     tadalafil (CIALIS) 10 MG tablet; Take 1 tablet (10 mg total) by mouth daily as needed for up to 30 days for erectile dysfunction.     Follow Up Instructions Return in about 3 months (around 05/16/2019).     I discussed the assessment and treatment plan with the patient. The patient was provided an opportunity to ask questions and all were answered. The patient agreed with the plan and demonstrated an understanding of the instructions.   The patient was advised to call back or seek an in-person evaluation if the symptoms worsen or if the condition fails to improve as anticipated.  I provided 16 minutes of non-face-to-face time during this encounter including median intraservice time, reviewing previous notes, labs, imaging, medications and explaining diagnosis and  management.  [name], FNP-BC

## 2019-02-18 MED FILL — AMLODIPINE BESYLATE 10 MG T: 10 | 30 days supply | Qty: 30 | Fill #2

## 2019-02-25 ENCOUNTER — Other Ambulatory Visit: Payer: Self-pay

## 2019-03-12 MED FILL — LOSARTAN POTASSIUM 25 MG TA: 25 | 30 days supply | Qty: 30 | Fill #1

## 2019-03-12 MED FILL — SILDENAFIL CITRATE 100 MG T: 100 | 30 days supply | Qty: 10 | Fill #1

## 2019-03-18 ENCOUNTER — Ambulatory Visit: Payer: Self-pay | Attending: Nurse Practitioner

## 2019-03-18 ENCOUNTER — Other Ambulatory Visit: Payer: Self-pay

## 2019-03-18 DIAGNOSIS — I10 Essential (primary) hypertension: Secondary | ICD-10-CM

## 2019-03-19 LAB — BASIC METABOLIC PANEL
BUN/Creatinine Ratio: 15 (ref 9–20)
BUN: 17 mg/dL (ref 6–24)
CO2: 24 mmol/L (ref 20–29)
Calcium: 9.6 mg/dL (ref 8.7–10.2)
Chloride: 103 mmol/L (ref 96–106)
Creatinine, Ser: 1.17 mg/dL (ref 0.76–1.27)
GFR calc Af Amer: 82 mL/min/{1.73_m2} (ref >59)
GFR calc non Af Amer: 71 mL/min/{1.73_m2} (ref >59)
Glucose: 106 mg/dL — ABNORMAL HIGH (ref 65–99)
Potassium: 4.4 mmol/L (ref 3.5–5.2)
Sodium: 140 mmol/L (ref 134–144)

## 2019-03-21 MED FILL — AMLODIPINE BESYLATE 10 MG T: 10 | 30 days supply | Qty: 30 | Fill #3

## 2019-03-21 NOTE — Progress Notes (Signed)
CMA spoke to patient to inform on results.  Pt. Verified DOB. Pt. Understood.  

## 2019-04-11 MED FILL — LOSARTAN POTASSIUM 25 MG TA: 25 | 30 days supply | Qty: 30 | Fill #2

## 2019-04-11 MED FILL — SILDENAFIL CITRATE 100 MG T: 100 | 30 days supply | Qty: 10 | Fill #2

## 2019-04-22 ENCOUNTER — Telehealth: Payer: Self-pay | Admitting: Nurse Practitioner

## 2019-04-22 MED FILL — TADALAFIL 10 MG TABS: 10 | 15 days supply | Qty: 5 | Fill #1

## 2019-04-22 MED FILL — AMLODIPINE BESYLATE 10 MG T: 10 | 30 days supply | Qty: 30 | Fill #4

## 2019-04-22 NOTE — Telephone Encounter (Signed)
Pt would like to be called back he has  heart concerns please follow up

## 2019-04-22 NOTE — Telephone Encounter (Signed)
Patients call returned.  Patient identified by name and date of birth.  Patient contacted and asked about his complaint which he said he did not complain about.  Patient asked to talk to Dr. Army Melia.  Patient informed that Army Melia was not in office and a message could be left.   Nurse asked if there was something specific he needed to talk about but he insisted on talking to Carey.  Patient advised that Zelda would be informed.  Patient acknowledged understanding of advice.

## 2019-04-23 NOTE — Telephone Encounter (Signed)
Called. LVM. Needs to let Bryson Dames know what he needs prior to sending back to me

## 2019-04-24 NOTE — Telephone Encounter (Signed)
Patients call returned.  Patient identified by name and date of birth.  Patient only wanted and appointment but again would not reveal the nature of the situation.  Patient made an appointment for 8/3.  Patient acknowledged understanding of advice.

## 2019-05-13 ENCOUNTER — Other Ambulatory Visit: Payer: Self-pay

## 2019-05-13 ENCOUNTER — Ambulatory Visit: Payer: Self-pay | Attending: Nurse Practitioner | Admitting: Nurse Practitioner

## 2019-05-13 ENCOUNTER — Encounter: Payer: Self-pay | Admitting: Nurse Practitioner

## 2019-05-13 VITALS — BP 143/97 | HR 71 | Temp 98.8°F | Ht 73.0 in | Wt 200.6 lb

## 2019-05-13 DIAGNOSIS — N529 Male erectile dysfunction, unspecified: Secondary | ICD-10-CM

## 2019-05-13 DIAGNOSIS — I10 Essential (primary) hypertension: Secondary | ICD-10-CM

## 2019-05-13 MED ORDER — LOSARTAN POTASSIUM 25 MG PO TABS: 25.0000 mg | ORAL_TABLET | Freq: Every day | ORAL | 0 refills | Status: DC

## 2019-05-13 MED ORDER — SILDENAFIL CITRATE 100 MG PO TABS: 100.0000 mg | ORAL_TABLET | Freq: Every day | ORAL | 6 refills | Status: DC | PRN

## 2019-05-13 MED ORDER — AMLODIPINE BESYLATE 10 MG PO TABS: 10.0000 mg | ORAL_TABLET | Freq: Every day | ORAL | 0 refills | Status: DC

## 2019-05-13 MED FILL — LOSARTAN POTASSIUM 25 MG TA: 25 | 30 days supply | Qty: 30 | Fill #0

## 2019-05-13 MED FILL — SILDENAFIL CITRATE 100 MG T: 100 | 30 days supply | Qty: 10 | Fill #0

## 2019-05-13 NOTE — Progress Notes (Signed)
Assessment & Plan:  Mohan was seen today for follow-up.  Diagnoses and all orders for this visit:  Essential hypertension -     amLODipine (NORVASC) 10 MG tablet; Take 1 tablet (10 mg total) by mouth daily. -     losartan (COZAAR) 25 MG tablet; Take 1 tablet (25 mg total) by mouth daily. Continue all antihypertensives as prescribed.  Remember to bring in your blood pressure log with you for your follow up appointment.  DASH/Mediterranean Diets are healthier choices for HTN.    Erectile dysfunction, unspecified erectile dysfunction type -     sildenafil (VIAGRA) 100 MG tablet; Take 1 tablet (100 mg total) by mouth daily as needed for erectile dysfunction. Do not take more than 1 tablet in a 24 hour period  Patient has been counseled on age-appropriate routine health concerns for screening and prevention. These are reviewed and up-to-date. Referrals have been placed accordingly. Immunizations are up-to-date or declined.    Subjective:   Chief Complaint  Patient presents with  . Follow-up    Pt. is here for BP check and would like to talk to PCP regarding medication side effects. Pt  stated his blood pressure reading in the day time is higher than at night.    HPI Levy Guimond 53 y.o. male presents to office today for Essential Hypertension and Erectile Dysfunction.   Essential Hypertension Blood pressure slightly elevated today. He states blood pressure readings at home are 110-120/80s. Will not make any adjustments with his medications at this time based on home readings. Denies chest pain, shortness of breath, palpitations, lightheadedness, dizziness, headaches or BLE edema.  BP Readings from Last 3 Encounters:  05/13/19 (!) 143/97  12/19/18 139/81  11/20/18 (!) 143/93    Erectile Dysfunction Requesting to "go back" to Viagra due to ineffectiveness of Cialis.  Onset of dysfunction was severalmonthsago and was gradualin onset with antihypertensive use. Patient states the  nature of difficulty is both attaining and maintaining erection. Full erections occuron awakening. Partial erections occurwith intercourse. Libidois notaffected. Risk factors for ED include cardiovascular disease and antihypertensive medications,see above. Patient denies history ofcranial, spinal, or pelvic trauma. Patient's expectations as to sexual functionare the ability to maintain and achieve an erection. Patient's description of relationship w/partner GOOD. Previous treatment of ED includes: VIAGRA and Cialis.   Review of Systems  Constitutional: Negative for fever, malaise/fatigue and weight loss.  HENT: Negative.  Negative for nosebleeds.   Eyes: Negative.  Negative for blurred vision, double vision and photophobia.  Respiratory: Negative.  Negative for cough and shortness of breath.   Cardiovascular: Negative.  Negative for chest pain, palpitations and leg swelling.  Gastrointestinal: Negative.  Negative for heartburn, nausea and vomiting.  Genitourinary:       ED   Musculoskeletal: Negative.  Negative for myalgias.  Neurological: Negative.  Negative for dizziness, focal weakness, seizures and headaches.  Psychiatric/Behavioral: Negative.  Negative for suicidal ideas.    Past Medical History:  Diagnosis Date  . Erectile dysfunction   . Hypertension     History reviewed. No pertinent surgical history.  Family History  Problem Relation Age of Onset  . Cancer Neg Hx   . Diabetes Neg Hx     Social History Reviewed with no changes to be made today.   Outpatient Medications Prior to Visit  Medication Sig Dispense Refill  . Multiple Vitamin (MULTIVITAMIN WITH MINERALS) TABS tablet Take 1 tablet by mouth daily.    Marland Kitchen amLODipine (NORVASC) 10 MG tablet Take 1  tablet (10 mg total) by mouth daily. 90 tablet 3  . losartan (COZAAR) 25 MG tablet Take 1 tablet (25 mg total) by mouth daily. 30 tablet 2  . meloxicam (MOBIC) 7.5 MG tablet Take 1 tablet (7.5 mg total) by mouth  daily. (Patient not taking: Reported on 12/19/2018) 30 tablet 1  . tadalafil (CIALIS) 10 MG tablet Take 1 tablet (10 mg total) by mouth daily as needed for up to 30 days for erectile dysfunction. 5 tablet 1   No facility-administered medications prior to visit.     Allergies  Allergen Reactions  . Lisinopril   . Hctz [Hydrochlorothiazide] Other (See Comments)    Headache        Objective:    BP (!) 143/97 (BP Location: Right Arm, Patient Position: Sitting, Cuff Size: Normal)   Pulse 71   Temp 98.8 F (37.1 C) (Oral)   Ht 6\' 1"  (1.854 m)   Wt 200 lb 9.6 oz (91 kg)   SpO2 98%   BMI 26.47 kg/m  Wt Readings from Last 3 Encounters:  05/13/19 200 lb 9.6 oz (91 kg)  12/19/18 196 lb (88.9 kg)  10/15/18 190 lb (86.2 kg)    Physical Exam Vitals signs and nursing note reviewed.  Constitutional:      Appearance: He is well-developed.  HENT:     Head: Normocephalic and atraumatic.  Neck:     Musculoskeletal: Normal range of motion.  Cardiovascular:     Rate and Rhythm: Normal rate and regular rhythm.     Heart sounds: Normal heart sounds. No murmur. No friction rub. No gallop.   Pulmonary:     Effort: Pulmonary effort is normal. No tachypnea or respiratory distress.     Breath sounds: Normal breath sounds. No decreased breath sounds, wheezing, rhonchi or rales.  Chest:     Chest wall: No tenderness.  Abdominal:     General: Bowel sounds are normal.     Palpations: Abdomen is soft.  Musculoskeletal: Normal range of motion.  Skin:    General: Skin is warm and dry.  Neurological:     Mental Status: He is alert and oriented to person, place, and time.     Coordination: Coordination normal.  Psychiatric:        Behavior: Behavior normal. Behavior is cooperative.        Thought Content: Thought content normal.        Judgment: Judgment normal.          Patient has been counseled extensively about nutrition and exercise as well as the importance of adherence with  medications and regular follow-up. The patient was given clear instructions to go to ER or return to medical center if symptoms don't improve, worsen or new problems develop. The patient verbalized understanding.   Follow-up: Return for schedule for physical .   [name], FNP-BC [facility] and [facility], [place]   05/13/2019, 11:51 AM

## 2019-05-20 MED FILL — AMLODIPINE BESYLATE 10 MG T: 10 | 30 days supply | Qty: 30 | Fill #5

## 2019-06-11 MED FILL — SILDENAFIL CITRATE 100 MG T: 100 | 30 days supply | Qty: 10 | Fill #1

## 2019-06-11 MED FILL — LOSARTAN POTASSIUM 25 MG TA: 25 | 30 days supply | Qty: 30 | Fill #1

## 2019-06-20 MED FILL — AMLODIPINE BESYLATE 10 MG T: 10 | 30 days supply | Qty: 30 | Fill #6

## 2019-07-01 ENCOUNTER — Ambulatory Visit: Payer: Self-pay | Attending: Nurse Practitioner | Admitting: Nurse Practitioner

## 2019-07-01 ENCOUNTER — Other Ambulatory Visit: Payer: Self-pay

## 2019-07-01 ENCOUNTER — Encounter: Payer: Self-pay | Admitting: Nurse Practitioner

## 2019-07-01 VITALS — BP 138/88 | HR 69 | Temp 98.8°F | Ht 73.0 in | Wt 204.0 lb

## 2019-07-01 DIAGNOSIS — M545 Low back pain, unspecified: Secondary | ICD-10-CM

## 2019-07-01 DIAGNOSIS — Z0000 Encounter for general adult medical examination without abnormal findings: Secondary | ICD-10-CM

## 2019-07-01 MED ORDER — CYCLOBENZAPRINE HCL 10 MG PO TABS: 10.0000 mg | ORAL_TABLET | Freq: Three times a day (TID) | ORAL | 1 refills | Status: DC | PRN

## 2019-07-01 MED FILL — CYCLOBENZAPRINE 10 MG TAB: 10 | 20 days supply | Qty: 60 | Fill #0

## 2019-07-01 NOTE — Progress Notes (Signed)
Assessment & Plan:  Erik Choi was seen today for annual exam.  Diagnoses and all orders for this visit:  Encounter for annual physical exam -     CBC -     Basic metabolic panel -     Lipid panel  Acute left-sided low back pain without sciatica -     cyclobenzaprine (FLEXERIL) 10 MG tablet; Take 1 tablet (10 mg total) by mouth 3 (three) times daily as needed for muscle spasms. No inciting injury. Aggravating factors: twisting, bending, recumbency Work on losing weight to help reduce back pain. May alternate with heat and ice application for pain relief. May also alternate with acetaminophen and Ibuprofen as prescribed for back pain. Other alternatives include massage, acupuncture and water aerobics.  You must stay active and avoid a sedentary lifestyle.    Patient has been counseled on age-appropriate routine health concerns for screening and prevention. These are reviewed and up-to-date. Referrals have been placed accordingly. Immunizations are up-to-date or declined.    Subjective:   Chief Complaint  Patient presents with  . Annual Exam    Pt. is here for a physical.    HPI Erik Choi 53 y.o. male presents to office today for annual physical.   BP Readings from Last 3 Encounters:  07/01/19 138/88  05/13/19 (!) 143/97  12/19/18 139/81   Review of Systems  Constitutional: Negative for fever, malaise/fatigue and weight loss.  HENT: Negative.  Negative for nosebleeds.   Eyes: Negative.  Negative for blurred vision, double vision and photophobia.  Respiratory: Negative.  Negative for cough and shortness of breath.   Cardiovascular: Negative.  Negative for chest pain, palpitations and leg swelling.  Gastrointestinal: Negative.  Negative for heartburn, nausea and vomiting.  Genitourinary: Negative for dysuria, flank pain, frequency, hematuria and urgency.       Erectile dysfunction  Musculoskeletal: Positive for back pain and myalgias.  Skin: Negative.   Neurological:  Negative.  Negative for dizziness, focal weakness, seizures and headaches.  Endo/Heme/Allergies: Negative.   Psychiatric/Behavioral: Negative.  Negative for suicidal ideas.    Past Medical History:  Diagnosis Date  . Erectile dysfunction   . Hypertension     History reviewed. No pertinent surgical history.  Family History  Problem Relation Age of Onset  . Cancer Neg Hx   . Diabetes Neg Hx     Social History Reviewed with no changes to be made today.   Outpatient Medications Prior to Visit  Medication Sig Dispense Refill  . amLODipine (NORVASC) 10 MG tablet Take 1 tablet (10 mg total) by mouth daily. 90 tablet 0  . losartan (COZAAR) 25 MG tablet Take 1 tablet (25 mg total) by mouth daily. 90 tablet 0  . Multiple Vitamin (MULTIVITAMIN WITH MINERALS) TABS tablet Take 1 tablet by mouth daily.    . sildenafil (VIAGRA) 100 MG tablet Take 1 tablet (100 mg total) by mouth daily as needed for erectile dysfunction. 10 tablet 6  . meloxicam (MOBIC) 7.5 MG tablet Take 1 tablet (7.5 mg total) by mouth daily. (Patient not taking: Reported on 07/01/2019) 30 tablet 1   No facility-administered medications prior to visit.     Allergies  Allergen Reactions  . Lisinopril   . Hctz [Hydrochlorothiazide] Other (See Comments)    Headache        Objective:    BP 138/88 (BP Location: Right Arm, Patient Position: Sitting, Cuff Size: Large)   Pulse 69   Temp 98.8 F (37.1 C) (Oral)   Ht 6'  1" (1.854 m)   Wt 204 lb (92.5 kg)   SpO2 99%   BMI 26.91 kg/m  Wt Readings from Last 3 Encounters:  07/01/19 204 lb (92.5 kg)  05/13/19 200 lb 9.6 oz (91 kg)  12/19/18 196 lb (88.9 kg)    Physical Exam Constitutional:      Appearance: He is well-developed.  HENT:     Head: Normocephalic and atraumatic.     Right Ear: Hearing, tympanic membrane, ear canal and external ear normal.     Left Ear: Hearing, tympanic membrane, ear canal and external ear normal.     Nose: Nose normal. No mucosal edema  or rhinorrhea.     Mouth/Throat:     Pharynx: Uvula midline.     Tonsils: No tonsillar exudate. 1+ on the right. 1+ on the left.  Eyes:     General: Lids are normal. No scleral icterus.    Conjunctiva/sclera: Conjunctivae normal.     Pupils: Pupils are equal, round, and reactive to light.  Neck:     Musculoskeletal: Normal range of motion and neck supple.     Thyroid: No thyromegaly.     Trachea: No tracheal deviation.  Cardiovascular:     Rate and Rhythm: Normal rate and regular rhythm.     Heart sounds: Normal heart sounds. No murmur. No friction rub. No gallop.   Pulmonary:     Effort: Pulmonary effort is normal. No respiratory distress.     Breath sounds: Normal breath sounds. No wheezing or rales.  Chest:     Chest wall: No mass or tenderness.     Breasts:        Right: No inverted nipple, mass, nipple discharge, skin change or tenderness.        Left: No inverted nipple, mass, nipple discharge, skin change or tenderness.  Abdominal:     General: Bowel sounds are normal. There is no distension.     Palpations: Abdomen is soft. There is no mass.     Tenderness: There is no abdominal tenderness. There is no guarding or rebound.     Hernia: There is no hernia in the left inguinal area.  Genitourinary:    Penis: Normal.      Scrotum/Testes: Normal.        Right: Mass, tenderness or swelling not present. Right testis is descended. Cremasteric reflex is present.         Left: Mass, tenderness or swelling not present. Left testis is descended. Cremasteric reflex is present.   Musculoskeletal: Normal range of motion.        General: No deformity.     Lumbar back: He exhibits tenderness and spasm.       Back:  Lymphadenopathy:     Cervical: No cervical adenopathy.     Lower Body: No right inguinal adenopathy. No left inguinal adenopathy.  Skin:    General: Skin is warm and dry.     Capillary Refill: Capillary refill takes less than 2 seconds.     Findings: No erythema.   Neurological:     Mental Status: He is alert and oriented to person, place, and time.     Cranial Nerves: No cranial nerve deficit.     Sensory: Sensation is intact.     Motor: Motor function is intact. No abnormal muscle tone.     Coordination: Coordination is intact. Coordination normal.     Gait: Gait is intact.     Deep Tendon Reflexes: Reflexes normal.  Reflex Scores:      Patellar reflexes are 1+ on the right side and 1+ on the left side. Psychiatric:        Behavior: Behavior normal.        Thought Content: Thought content normal.        Judgment: Judgment normal.        Patient has been counseled extensively about nutrition and exercise as well as the importance of adherence with medications and regular follow-up. The patient was given clear instructions to go to ER or return to medical center if symptoms don't improve, worsen or new problems develop. The patient verbalized understanding.   Follow-up: Return for see luke for home BP monitor correlation with our BP monitor.   [name], FNP-BC [facility] and [place], [place]   07/01/2019, 1:50 PM

## 2019-07-01 NOTE — Patient Instructions (Signed)
Preventive Care 40-52 Years Old, Male Preventive care refers to lifestyle choices and visits with your health care provider that can promote health and wellness. This includes:  A yearly physical exam. This is also called an annual well check.  Regular dental and eye exams.  Immunizations.  Screening for certain conditions.  Healthy lifestyle choices, such as eating a healthy diet, getting regular exercise, not using drugs or products that contain nicotine and tobacco, and limiting alcohol use. What can I expect for my preventive care visit? Physical exam Your health care provider will check:  Height and weight. These may be used to calculate body mass index (BMI), which is a measurement that tells if you are at a healthy weight.  Heart rate and blood pressure.  Your skin for abnormal spots. Counseling Your health care provider may ask you questions about:  Alcohol, tobacco, and drug use.  Emotional well-being.  Home and relationship well-being.  Sexual activity.  Eating habits.  Work and work environment. What immunizations do I need?  Influenza (flu) vaccine  This is recommended every year. Tetanus, diphtheria, and pertussis (Tdap) vaccine  You may need a Td booster every 10 years. Varicella (chickenpox) vaccine  You may need this vaccine if you have not already been vaccinated. Zoster (shingles) vaccine  You may need this after age 60. Measles, mumps, and rubella (MMR) vaccine  You may need at least one dose of MMR if you were born in 1957 or later. You may also need a second dose. Pneumococcal conjugate (PCV13) vaccine  You may need this if you have certain conditions and were not previously vaccinated. Pneumococcal polysaccharide (PPSV23) vaccine  You may need one or two doses if you smoke cigarettes or if you have certain conditions. Meningococcal conjugate (MenACWY) vaccine  You may need this if you have certain conditions. Hepatitis A vaccine   You may need this if you have certain conditions or if you travel or work in places where you may be exposed to hepatitis A. Hepatitis B vaccine  You may need this if you have certain conditions or if you travel or work in places where you may be exposed to hepatitis B. Haemophilus influenzae type b (Hib) vaccine  You may need this if you have certain risk factors. Human papillomavirus (HPV) vaccine  If recommended by your health care provider, you may need three doses over 6 months. You may receive vaccines as individual doses or as more than one vaccine together in one shot (combination vaccines). Talk with your health care provider about the risks and benefits of combination vaccines. What tests do I need? Blood tests  Lipid and cholesterol levels. These may be checked every 5 years, or more frequently if you are over 50 years old.  Hepatitis C test.  Hepatitis B test. Screening  Lung cancer screening. You may have this screening every year starting at age 55 if you have a 30-pack-year history of smoking and currently smoke or have quit within the past 15 years.  Prostate cancer screening. Recommendations will vary depending on your family history and other risks.  Colorectal cancer screening. All adults should have this screening starting at age 50 and continuing until age 75. Your health care provider may recommend screening at age 45 if you are at increased risk. You will have tests every 1-10 years, depending on your results and the type of screening test.  Diabetes screening. This is done by checking your blood sugar (glucose) after you have not eaten   for a while (fasting). You may have this done every 1-3 years.  Sexually transmitted disease (STD) testing. Follow these instructions at home: Eating and drinking  Eat a diet that includes fresh fruits and vegetables, whole grains, lean protein, and low-fat dairy products.  Take vitamin and mineral supplements as recommended  by your health care provider.  Do not drink alcohol if your health care provider tells you not to drink.  If you drink alcohol: ? Limit how much you have to 0-2 drinks a day. ? Be aware of how much alcohol is in your drink. In the [place] one drink equals one 12 oz bottle of beer (355 mL), one 5 oz glass of wine (148 mL), or one 1 oz glass of hard liquor (44 mL). Lifestyle  Take daily care of your teeth and gums.  Stay active. Exercise for at least 30 minutes on 5 or more days each week.  Do not use any products that contain nicotine or tobacco, such as cigarettes, e-cigarettes, and chewing tobacco. If you need help quitting, ask your health care provider.  If you are sexually active, practice safe sex. Use a condom or other form of protection to prevent STIs (sexually transmitted infections).  Talk with your health care provider about taking a low-dose aspirin every day starting at age 54. What's next?  Go to your health care provider once a year for a well check visit.  Ask your health care provider how often you should have your eyes and teeth checked.  Stay up to date on all vaccines. This information is not intended to replace advice given to you by your health care provider. Make sure you discuss any questions you have with your health care provider. Document Released: 10/23/2015 Document Revised: 09/20/2018 Document Reviewed: 09/20/2018 Elsevier Patient Education  2020 [employer]  Preventive Care 50-3 Years Old, Male Preventive care refers to lifestyle choices and visits with your health care provider that can promote health and wellness. This includes:  A yearly physical exam. This is also called an annual well check.  Regular dental and eye exams.  Immunizations.  Screening for certain conditions.  Healthy lifestyle choices, such as eating a healthy diet, getting regular exercise, not using drugs or products that contain nicotine and tobacco, and limiting alcohol  use. What can I expect for my preventive care visit? Physical exam Your health care provider will check:  Height and weight. These may be used to calculate body mass index (BMI), which is a measurement that tells if you are at a healthy weight.  Heart rate and blood pressure.  Your skin for abnormal spots. Counseling Your health care provider may ask you questions about:  Alcohol, tobacco, and drug use.  Emotional well-being.  Home and relationship well-being.  Sexual activity.  Eating habits.  Work and work Statistician. What immunizations do I need?  Influenza (flu) vaccine  This is recommended every year. Tetanus, diphtheria, and pertussis (Tdap) vaccine  You may need a Td booster every 10 years. Varicella (chickenpox) vaccine  You may need this vaccine if you have not already been vaccinated. Zoster (shingles) vaccine  You may need this after age 20. Measles, mumps, and rubella (MMR) vaccine  You may need at least one dose of MMR if you were born in 1957 or later. You may also need a second dose. Pneumococcal conjugate (PCV13) vaccine  You may need this if you have certain conditions and were not previously vaccinated. Pneumococcal polysaccharide (PPSV23) vaccine  You  may need one or two doses if you smoke cigarettes or if you have certain conditions. Meningococcal conjugate (MenACWY) vaccine  You may need this if you have certain conditions. Hepatitis A vaccine  You may need this if you have certain conditions or if you travel or work in places where you may be exposed to hepatitis A. Hepatitis B vaccine  You may need this if you have certain conditions or if you travel or work in places where you may be exposed to hepatitis B. Haemophilus influenzae type b (Hib) vaccine  You may need this if you have certain risk factors. Human papillomavirus (HPV) vaccine  If recommended by your health care provider, you may need three doses over 6 months. You may  receive vaccines as individual doses or as more than one vaccine together in one shot (combination vaccines). Talk with your health care provider about the risks and benefits of combination vaccines. What tests do I need? Blood tests  Lipid and cholesterol levels. These may be checked every 5 years, or more frequently if you are over 75 years old.  Hepatitis C test.  Hepatitis B test. Screening  Lung cancer screening. You may have this screening every year starting at age 43 if you have a 30-pack-year history of smoking and currently smoke or have quit within the past 15 years.  Prostate cancer screening. Recommendations will vary depending on your family history and other risks.  Colorectal cancer screening. All adults should have this screening starting at age 69 and continuing until age 89. Your health care provider may recommend screening at age 60 if you are at increased risk. You will have tests every 1-10 years, depending on your results and the type of screening test.  Diabetes screening. This is done by checking your blood sugar (glucose) after you have not eaten for a while (fasting). You may have this done every 1-3 years.  Sexually transmitted disease (STD) testing. Follow these instructions at home: Eating and drinking  Eat a diet that includes fresh fruits and vegetables, whole grains, lean protein, and low-fat dairy products.  Take vitamin and mineral supplements as recommended by your health care provider.  Do not drink alcohol if your health care provider tells you not to drink.  If you drink alcohol: ? Limit how much you have to 0-2 drinks a day. ? Be aware of how much alcohol is in your drink. In the [place] one drink equals one 12 oz bottle of beer (355 mL), one 5 oz glass of wine (148 mL), or one 1 oz glass of hard liquor (44 mL). Lifestyle  Take daily care of your teeth and gums.  Stay active. Exercise for at least 30 minutes on 5 or more days each week.  Do  not use any products that contain nicotine or tobacco, such as cigarettes, e-cigarettes, and chewing tobacco. If you need help quitting, ask your health care provider.  If you are sexually active, practice safe sex. Use a condom or other form of protection to prevent STIs (sexually transmitted infections).  Talk with your health care provider about taking a low-dose aspirin every day starting at age 29. What's next?  Go to your health care provider once a year for a well check visit.  Ask your health care provider how often you should have your eyes and teeth checked.  Stay up to date on all vaccines. This information is not intended to replace advice given to you by your health care provider. Make  sure you discuss any questions you have with your health care provider. Document Released: 10/23/2015 Document Revised: 09/20/2018 Document Reviewed: 09/20/2018 Elsevier Patient Education  2020 [employer].

## 2019-07-02 LAB — LIPID PANEL
Chol/HDL Ratio: 4.3 ratio (ref 0.0–5.0)
Cholesterol, Total: 179 mg/dL (ref 100–199)
HDL: 42 mg/dL (ref >39)
LDL Chol Calc (NIH): 127 mg/dL — ABNORMAL HIGH (ref 0–99)
Triglycerides: 52 mg/dL (ref 0–149)
VLDL Cholesterol Cal: 10 mg/dL (ref 5–40)

## 2019-07-02 LAB — BASIC METABOLIC PANEL
BUN/Creatinine Ratio: 15 (ref 9–20)
BUN: 17 mg/dL (ref 6–24)
CO2: 26 mmol/L (ref 20–29)
Calcium: 10 mg/dL (ref 8.7–10.2)
Chloride: 102 mmol/L (ref 96–106)
Creatinine, Ser: 1.17 mg/dL (ref 0.76–1.27)
GFR calc Af Amer: 82 mL/min/{1.73_m2} (ref >59)
GFR calc non Af Amer: 71 mL/min/{1.73_m2} (ref >59)
Glucose: 109 mg/dL — ABNORMAL HIGH (ref 65–99)
Potassium: 4.4 mmol/L (ref 3.5–5.2)
Sodium: 141 mmol/L (ref 134–144)

## 2019-07-02 LAB — CBC
Hematocrit: 42.5 % (ref 37.5–51.0)
Hemoglobin: 13.4 g/dL (ref 13.0–17.7)
MCH: 27.7 pg (ref 26.6–33.0)
MCHC: 31.5 g/dL (ref 31.5–35.7)
MCV: 88 fL (ref 79–97)
Platelets: 270 10*3/uL (ref 150–450)
RBC: 4.84 x10E6/uL (ref 4.14–5.80)
RDW: 13.2 % (ref 11.6–15.4)
WBC: 4 10*3/uL (ref 3.4–10.8)

## 2019-07-09 MED FILL — SILDENAFIL CITRATE 100 MG T: 100 | 30 days supply | Qty: 10 | Fill #2

## 2019-07-09 MED FILL — LOSARTAN POTASSIUM 25 MG TA: 25 | 30 days supply | Qty: 30 | Fill #2

## 2019-07-22 ENCOUNTER — Other Ambulatory Visit: Payer: Self-pay

## 2019-07-22 ENCOUNTER — Encounter: Payer: Self-pay | Admitting: Pharmacist

## 2019-07-22 ENCOUNTER — Ambulatory Visit: Payer: Self-pay | Attending: Nurse Practitioner | Admitting: Pharmacist

## 2019-07-22 VITALS — BP 125/80 | HR 80

## 2019-07-22 DIAGNOSIS — I10 Essential (primary) hypertension: Secondary | ICD-10-CM

## 2019-07-22 NOTE — Patient Instructions (Signed)

## 2019-07-22 NOTE — Progress Notes (Signed)
   S:    PCP: [name]   Patient arrives in in good spirits. Presents to the clinic for hypertension management. Patient was referred by Zelda on 07/01/19. BP at that visit 138/88.   Today, pt denies chest pain, headache, shortness of breath, or blurred vision.    Patient reports adherence with medications.  Current BP Medications include:   - Amlodipine 10 mg.  - Losartan 25 mg daily.  Antihypertensives tried in the past include:  - none  Dietary habits include: limits salt, drinks 3-4 cups of tea daily Exercise habits include: reports going to the gym 2x/week Family / Social history: DM (father and sister), never smoker, denies alcohol  ASCVD risk: cannot calculate without lipid results  Home BP readings: 120-130s SBPs; brings in machine today  O:  L arm after 5 minutes rest 125/80, HR 80  Last 3 Office BP readings: BP Readings from Last 3 Encounters:  07/01/19 138/88  05/13/19 (!) 143/97  12/19/18 139/81   BMET    Component Value Date/Time   NA 141 07/01/2019 1103   K 4.4 07/01/2019 1103   CL 102 07/01/2019 1103   CO2 26 07/01/2019 1103   GLUCOSE 109 (H) 07/01/2019 1103   GLUCOSE 98 05/04/2018 2318   BUN 17 07/01/2019 1103   CREATININE 1.17 07/01/2019 1103   CALCIUM 10.0 07/01/2019 1103   GFRNONAA 71 07/01/2019 1103   GFRAA 82 07/01/2019 1103   Renal function: CrCl cannot be calculated (Unknown ideal weight.).  A/P: Hypertension longstanding currently controlled on current medications. BP Goal = <130/80 mmHg. Patient is adherent with current medications.  -Continue current regimen -HM: flu, tetanus due; pt declined -Counseled on lifestyle modifications for blood pressure control including reduced dietary sodium, increased exercise, adequate sleep  Results reviewed and written information provided. Total time in face-to-face counseling 15 minutes.   F/U Clinic Visit 10/15/2018 with PCP.  Benard Halsted, PharmD, [place] [phone]

## 2019-08-08 ENCOUNTER — Other Ambulatory Visit: Payer: Self-pay | Admitting: Nurse Practitioner

## 2019-08-08 DIAGNOSIS — I10 Essential (primary) hypertension: Secondary | ICD-10-CM

## 2019-08-08 MED FILL — LOSARTAN POTASSIUM 25 MG TA: 25 | 30 days supply | Qty: 30 | Fill #0

## 2019-08-08 MED FILL — SILDENAFIL CITRATE 100 MG T: 100 | 30 days supply | Qty: 10 | Fill #3

## 2019-08-19 MED FILL — AMLODIPINE BESYLATE 10 MG T: 10 | 30 days supply | Qty: 30 | Fill #8

## 2019-08-26 ENCOUNTER — Other Ambulatory Visit: Payer: Self-pay

## 2019-08-26 ENCOUNTER — Ambulatory Visit: Payer: Self-pay | Attending: Nurse Practitioner | Admitting: Pharmacist

## 2019-08-26 ENCOUNTER — Encounter: Payer: Self-pay | Admitting: Pharmacist

## 2019-08-26 VITALS — BP 120/81 | HR 69

## 2019-08-26 DIAGNOSIS — G8929 Other chronic pain: Secondary | ICD-10-CM

## 2019-08-26 DIAGNOSIS — I10 Essential (primary) hypertension: Secondary | ICD-10-CM

## 2019-08-26 DIAGNOSIS — M545 Low back pain, unspecified: Secondary | ICD-10-CM

## 2019-08-26 MED ORDER — MELOXICAM 7.5 MG PO TABS: 7.5000 mg | ORAL_TABLET | Freq: Every day | ORAL | 1 refills | Status: DC

## 2019-08-26 MED FILL — MELOXICAM 7.5 MG TABLET: 7.5 | 30 days supply | Qty: 30 | Fill #0

## 2019-08-26 NOTE — Patient Instructions (Signed)

## 2019-08-26 NOTE — Progress Notes (Signed)
   S:    PCP: [name]   Patient arrives in in good spirits. Presents to the clinic for hypertension management. Patient was referred by Zelda on 07/01/19. I saw him for a BP check on 07/22/19 and his BP looked good.   Today, pt denies chest pain, headache, shortness of breath, or blurred vision.    Patient reports adherence with medications.  Current BP Medications include:   - Amlodipine 10 mg.  - Losartan 25 mg daily.  Antihypertensives tried in the past include:  - none  Dietary habits include: limits salt, drinks 3-4 cups of tea daily Exercise habits include: reports going to the gym 2x/week Family / Social history: DM (father and sister), never smoker, denies alcohol  ASCVD risk: cannot calculate without lipid results  Home BP readings: 110s-120s SBPs; brings in machine today  O:  L arm after 5 minutes rest 120/81, HR 69  Last 3 Office BP readings: BP Readings from Last 3 Encounters:  08/26/19 120/81  07/22/19 125/80  07/01/19 138/88   BMET    Component Value Date/Time   NA 141 07/01/2019 1103   K 4.4 07/01/2019 1103   CL 102 07/01/2019 1103   CO2 26 07/01/2019 1103   GLUCOSE 109 (H) 07/01/2019 1103   GLUCOSE 98 05/04/2018 2318   BUN 17 07/01/2019 1103   CREATININE 1.17 07/01/2019 1103   CALCIUM 10.0 07/01/2019 1103   GFRNONAA 71 07/01/2019 1103   GFRAA 82 07/01/2019 1103   Renal function: CrCl cannot be calculated (Patient's most recent lab result is older than the maximum 21 days allowed.).  A/P: Hypertension longstanding currently controlled on current medications. BP Goal = <130/80 mmHg. Patient is adherent with current medications.  -Continue current regimen -HM: flu, tetanus due; pt declined -Counseled on lifestyle modifications for blood pressure control including reduced dietary sodium, increased exercise, adequate sleep  Results reviewed and written information provided. Total time in face-to-face counseling 15 minutes.   F/U Clinic Visit  10/28/2019 with PCP.  Benard Halsted, PharmD, [place] [phone]

## 2019-09-03 ENCOUNTER — Other Ambulatory Visit: Payer: Self-pay | Admitting: Nurse Practitioner

## 2019-09-03 DIAGNOSIS — Z0100 Encounter for examination of eyes and vision without abnormal findings: Secondary | ICD-10-CM

## 2019-09-04 MED FILL — SILDENAFIL CITRATE 100 MG T: 100 | 30 days supply | Qty: 10 | Fill #4

## 2019-09-04 MED FILL — MELOXICAM 7.5 MG TABLET: 7.5 | 30 days supply | Qty: 30 | Fill #0

## 2019-09-09 MED FILL — LOSARTAN POTASSIUM 25 MG TA: 25 | 30 days supply | Qty: 30 | Fill #1

## 2019-09-18 MED FILL — AMLODIPINE BESYLATE 10 MG T: 10 | 30 days supply | Qty: 30 | Fill #9

## 2019-10-07 MED FILL — SILDENAFIL CITRATE 100 MG T: 100 | 30 days supply | Qty: 10 | Fill #5

## 2019-10-07 MED FILL — LOSARTAN POTASSIUM 25 MG TA: 25 | 30 days supply | Qty: 30 | Fill #2

## 2019-10-18 MED FILL — AMLODIPINE BESYLATE 10 MG T: 10 | 30 days supply | Qty: 30 | Fill #10

## 2019-10-28 ENCOUNTER — Other Ambulatory Visit: Payer: Self-pay

## 2019-10-28 ENCOUNTER — Ambulatory Visit: Payer: Self-pay | Attending: Nurse Practitioner | Admitting: Nurse Practitioner

## 2019-10-28 ENCOUNTER — Encounter: Payer: Self-pay | Admitting: Nurse Practitioner

## 2019-10-28 DIAGNOSIS — I10 Essential (primary) hypertension: Secondary | ICD-10-CM

## 2019-10-28 MED ORDER — LOSARTAN POTASSIUM 25 MG PO TABS: 25.0000 mg | ORAL_TABLET | Freq: Every day | ORAL | 1 refills | Status: DC

## 2019-10-28 MED ORDER — AMLODIPINE BESYLATE 10 MG PO TABS: 10.0000 mg | ORAL_TABLET | Freq: Every day | ORAL | 1 refills | Status: DC

## 2019-10-28 NOTE — Progress Notes (Signed)
Virtual Visit via Telephone Note Due to national recommendations of social distancing due to [place] 19, telehealth visit is felt to be most appropriate for this patient at this time.  I discussed the limitations, risks, security and privacy concerns of performing an evaluation and management service by telephone and the availability of in person appointments. I also discussed with the patient that there may be a patient responsible charge related to this service. The patient expressed understanding and agreed to proceed.    I connected with Erik Choi on 10/28/19  at  11:10 AM EST  EDT by telephone and verified that I am speaking with the correct person using two identifiers.   Consent I discussed the limitations, risks, security and privacy concerns of performing an evaluation and management service by telephone and the availability of in person appointments. I also discussed with the patient that there may be a patient responsible charge related to this service. The patient expressed understanding and agreed to proceed.   Location of Patient: Private Residence   Location of Provider: [place] and [place] participating in Telemedicine visit: [name] FNP-BC [place]    History of Present Illness: Telemedicine visit for: Essential Hypertension  Working out several times per week. Running on the treadmill and lifting weights. Denies chest pain, shortness of breath, palpitations, lightheadedness, dizziness, headaches or BLE edema. Monitoring blood pressure at home with most recent reading 112/78. Taking losartan 25 mg daily and amlodipine 10 mg as prescribed.  BP Readings from Last 3 Encounters:  08/26/19 120/81  07/22/19 125/80  07/01/19 138/88       Past Medical History:  Diagnosis Date  . Erectile dysfunction   . Hypertension     History reviewed. No pertinent surgical history.  Family History  Problem Relation Age of  Onset  . Cancer Neg Hx   . Diabetes Neg Hx     Social History   Socioeconomic History  . Marital status: Married    Spouse name: Not on file  . Number of children: Not on file  . Years of education: Not on file  . Highest education level: Not on file  Occupational History  . Not on file  Tobacco Use  . Smoking status: Never Smoker  . Smokeless tobacco: Never Used  Substance and Sexual Activity  . Alcohol use: Never  . Drug use: Never  . Sexual activity: Yes  Other Topics Concern  . Not on file  Social History Narrative  . Not on file   Social Determinants of Health   Financial Resource Strain:   . Difficulty of Paying Living Expenses: Not on file  Food Insecurity:   . Worried About Charity fundraiser in the Last Year: Not on file  . Ran Out of Food in the Last Year: Not on file  Transportation Needs:   . Lack of Transportation (Medical): Not on file  . Lack of Transportation (Non-Medical): Not on file  Physical Activity:   . Days of Exercise per Week: Not on file  . Minutes of Exercise per Session: Not on file  Stress:   . Feeling of Stress : Not on file  Social Connections:   . Frequency of Communication with Friends and Family: Not on file  . Frequency of Social Gatherings with Friends and Family: Not on file  . Attends Religious Services: Not on file  . Active Member of Clubs or Organizations: Not on file  . Attends  Club or Organization Meetings: Not on file  . Marital Status: Not on file     Observations/Objective: Awake, alert and oriented x 3   Review of Systems  Constitutional: Negative for fever, malaise/fatigue and weight loss.  HENT: Negative.  Negative for nosebleeds.   Eyes: Negative.  Negative for blurred vision, double vision and photophobia.  Respiratory: Negative.  Negative for cough and shortness of breath.   Cardiovascular: Negative.  Negative for chest pain, palpitations and leg swelling.  Gastrointestinal: Negative.  Negative for  heartburn, nausea and vomiting.  Genitourinary:       Erectile dysfunction  Musculoskeletal: Negative.  Negative for myalgias.  Neurological: Negative.  Negative for dizziness, focal weakness, seizures and headaches.  Psychiatric/Behavioral: Negative.  Negative for suicidal ideas.    Assessment and Plan: Erik Choi was seen today for follow-up.  Diagnoses and all orders for this visit:  Essential hypertension -     losartan (COZAAR) 25 MG tablet; Take 1 tablet (25 mg total) by mouth daily. -     amLODipine (NORVASC) 10 MG tablet; Take 1 tablet (10 mg total) by mouth daily. -     CMP14+EGFR; Future Continue all antihypertensives as prescribed.  Remember to bring in your blood pressure log with you for your follow up appointment.  DASH/Mediterranean Diets are healthier choices for HTN.      Follow Up Instructions Return in about 3 months (around 01/26/2020).     I discussed the assessment and treatment plan with the patient. The patient was provided an opportunity to ask questions and all were answered. The patient agreed with the plan and demonstrated an understanding of the instructions.   The patient was advised to call back or seek an in-person evaluation if the symptoms worsen or if the condition fails to improve as anticipated.  I provided 13 minutes of non-face-to-face time during this encounter including median intraservice time, reviewing previous notes, labs, imaging, medications and explaining diagnosis and management.  [name], FNP-BC

## 2019-11-11 MED FILL — AMLODIPINE BESYLATE 10 MG T: 10 | 30 days supply | Qty: 30 | Fill #11

## 2019-11-11 MED FILL — SILDENAFIL CITRATE 100 MG T: 100 | 30 days supply | Qty: 10 | Fill #6

## 2019-11-11 MED FILL — LOSARTAN POTASSIUM 25 MG TA: 25 | 30 days supply | Qty: 30 | Fill #0

## 2019-11-12 ENCOUNTER — Other Ambulatory Visit: Payer: Self-pay

## 2019-11-12 ENCOUNTER — Ambulatory Visit: Payer: Self-pay | Attending: Nurse Practitioner

## 2019-11-12 DIAGNOSIS — I10 Essential (primary) hypertension: Secondary | ICD-10-CM

## 2019-11-13 LAB — CMP14+EGFR
ALT: 22 IU/L (ref 0–44)
AST: 33 IU/L (ref 0–40)
Albumin/Globulin Ratio: 1.7 (ref 1.2–2.2)
Albumin: 4.4 g/dL (ref 3.8–4.9)
Alkaline Phosphatase: 109 IU/L (ref 39–117)
BUN/Creatinine Ratio: 11 (ref 9–20)
BUN: 14 mg/dL (ref 6–24)
Bilirubin Total: 0.3 mg/dL (ref 0.0–1.2)
CO2: 23 mmol/L (ref 20–29)
Calcium: 9.3 mg/dL (ref 8.7–10.2)
Chloride: 103 mmol/L (ref 96–106)
Creatinine, Ser: 1.24 mg/dL (ref 0.76–1.27)
GFR calc Af Amer: 77 mL/min/{1.73_m2} (ref >59)
GFR calc non Af Amer: 66 mL/min/{1.73_m2} (ref >59)
Globulin, Total: 2.6 g/dL (ref 1.5–4.5)
Glucose: 91 mg/dL (ref 65–99)
Potassium: 3.8 mmol/L (ref 3.5–5.2)
Sodium: 140 mmol/L (ref 134–144)
Total Protein: 7 g/dL (ref 6.0–8.5)

## 2019-12-09 ENCOUNTER — Other Ambulatory Visit: Payer: Self-pay | Admitting: Nurse Practitioner

## 2019-12-09 DIAGNOSIS — N529 Male erectile dysfunction, unspecified: Secondary | ICD-10-CM

## 2019-12-09 MED FILL — LOSARTAN POTASSIUM 25 MG TA: 25 | 30 days supply | Qty: 30 | Fill #1

## 2019-12-09 MED FILL — SILDENAFIL CITRATE 100 MG T: 100 | 30 days supply | Qty: 10 | Fill #0

## 2019-12-17 MED FILL — AMLODIPINE BESYLATE 10 MG T: 10 | 90 days supply | Qty: 90 | Fill #0

## 2020-01-06 MED FILL — SILDENAFIL CITRATE 100 MG T: 100 | 30 days supply | Qty: 10 | Fill #1

## 2020-01-06 MED FILL — LOSARTAN POTASSIUM 25 MG TA: 25 | 30 days supply | Qty: 30 | Fill #2

## 2020-02-04 MED FILL — LOSARTAN POTASSIUM 25 MG TA: 25 | 30 days supply | Qty: 30 | Fill #3

## 2020-02-04 MED FILL — SILDENAFIL CITRATE 100 MG T: 100 | 30 days supply | Qty: 10 | Fill #2

## 2020-03-10 MED FILL — SILDENAFIL CITRATE 100 MG T: 100 | 30 days supply | Qty: 10 | Fill #3

## 2020-03-10 MED FILL — LOSARTAN POTASSIUM 25 MG TA: 25 | 30 days supply | Qty: 30 | Fill #4

## 2020-04-01 IMAGING — CT CT ABD-PELV W/ CM
2 of 5 series · 15 of 46 positions shown, 17 images · IV contrast (ISOVUE)
Comparison: None.

CLINICAL DATA: Dental procedure 2 weeks ago with elevated blood
pressure at the time of the procedure abdominal pain starting
[REDACTED]. Persistent diarrhea on [REDACTED].

EXAM:
CT ABDOMEN AND PELVIS WITH CONTRAST
TECHNIQUE: Multidetector CT imaging of the abdomen and pelvis was performed
using the standard protocol following bolus administration of
intravenous contrast.
CONTRAST:  100mL RMTM24-P55 IOPAMIDOL (RMTM24-P55) INJECTION 61%

[Series 2: axial st · axial · 0.68mm/px · z∈[-470,-60]mm · 12 of 96 slices shown, 14 images]
[im 7/96  soft-tissue]
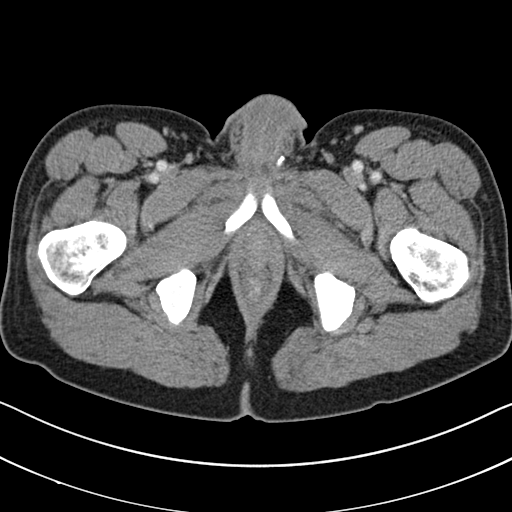
[im 7/96  bone]
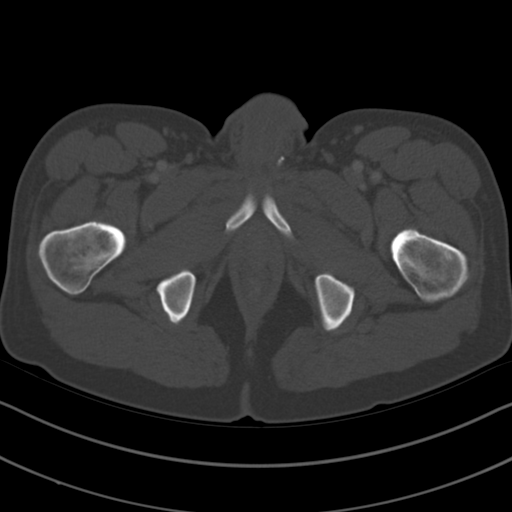
[im 13/96  soft-tissue]
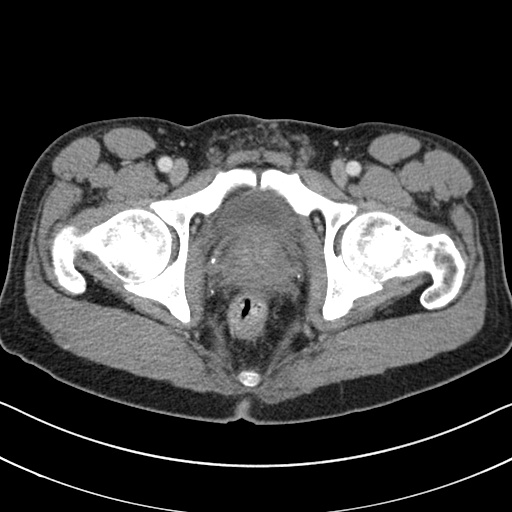
[im 20/96  soft-tissue]
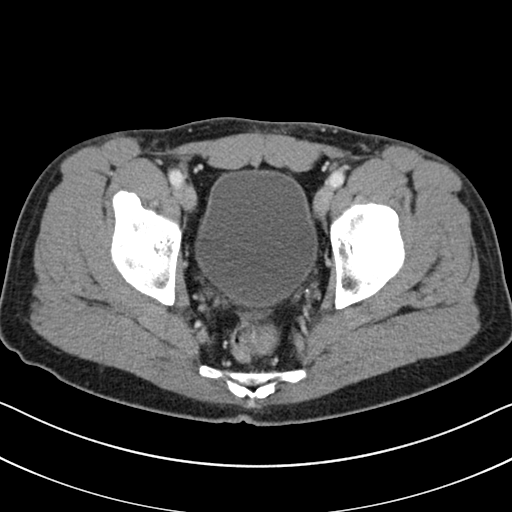
[im 32/96  soft-tissue]
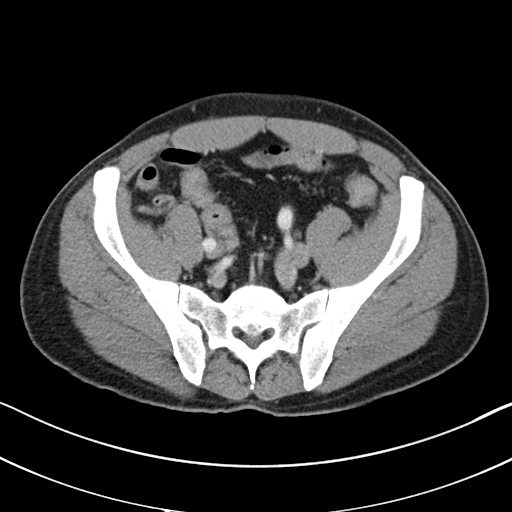
[im 39/96  soft-tissue]
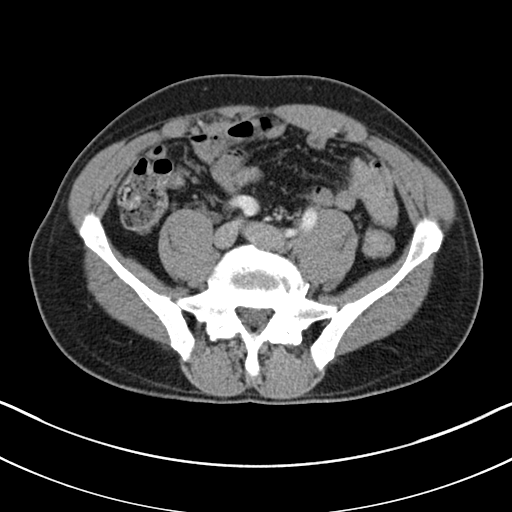
[im 45/96  soft-tissue]
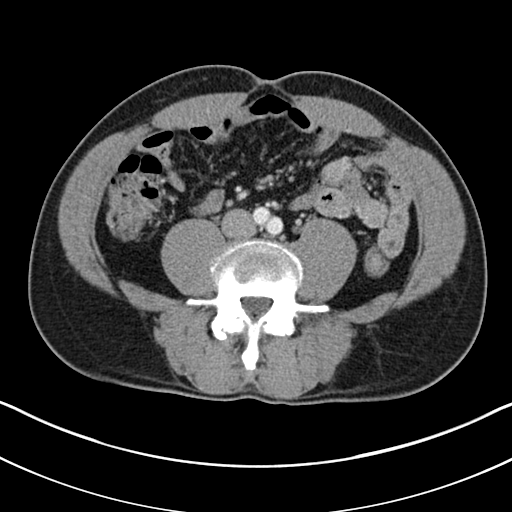
[im 51/96  soft-tissue]
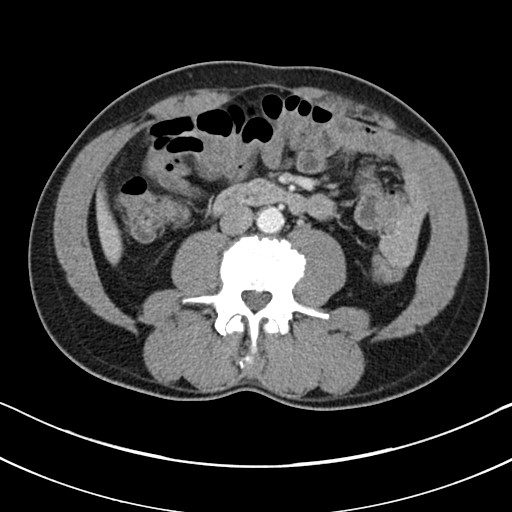
[im 58/96  soft-tissue]
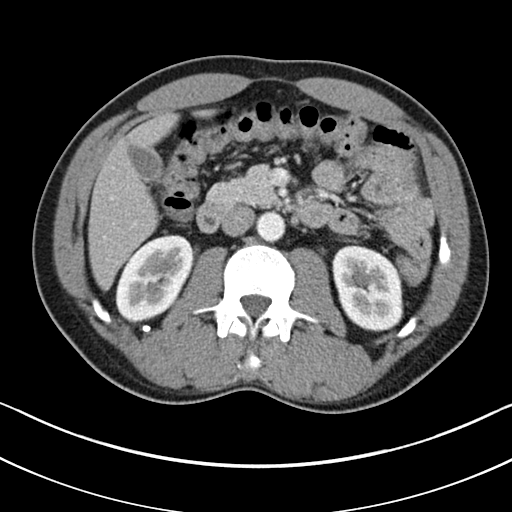
[im 64/96  soft-tissue]
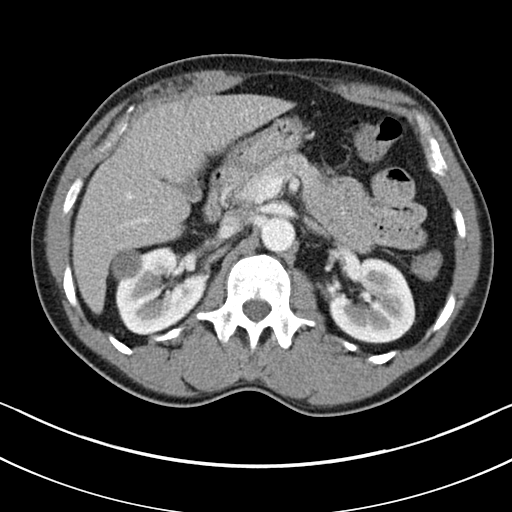
[im 64/96  bone]
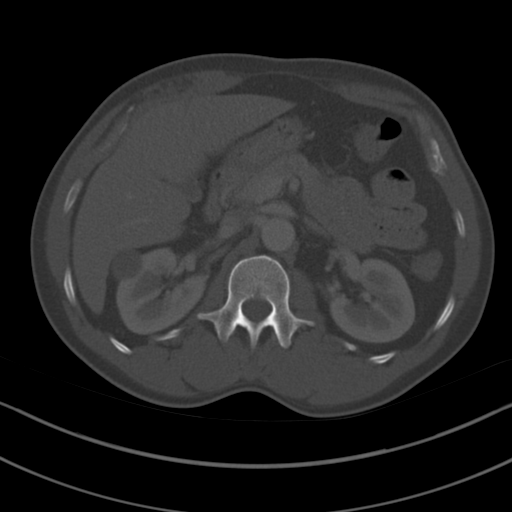
[im 77/96  soft-tissue]
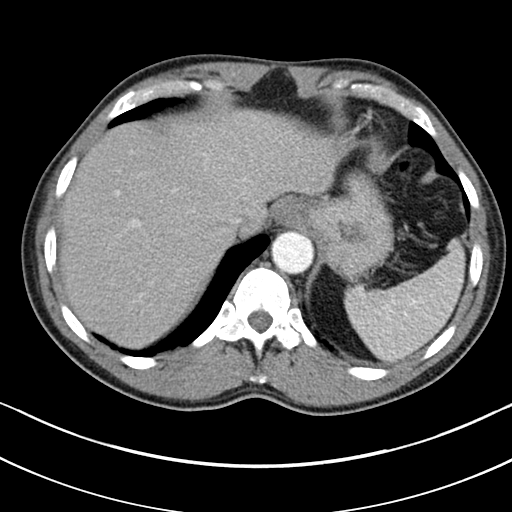
[im 83/96  soft-tissue]
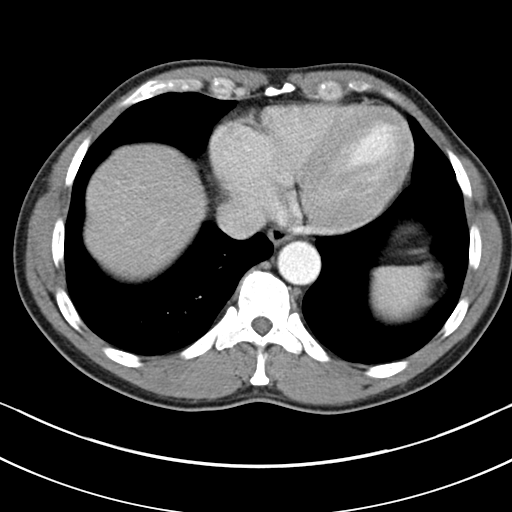
[im 89/96  soft-tissue]
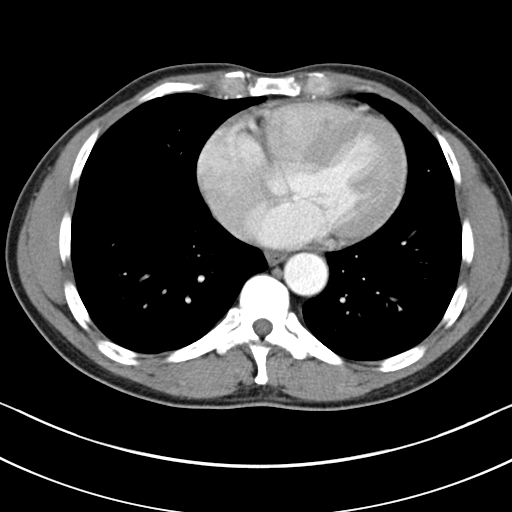

[Series 5: coronal st · coronal · 0.73mm/px · 3 of 94 slices shown]
[im 32/94  soft-tissue]
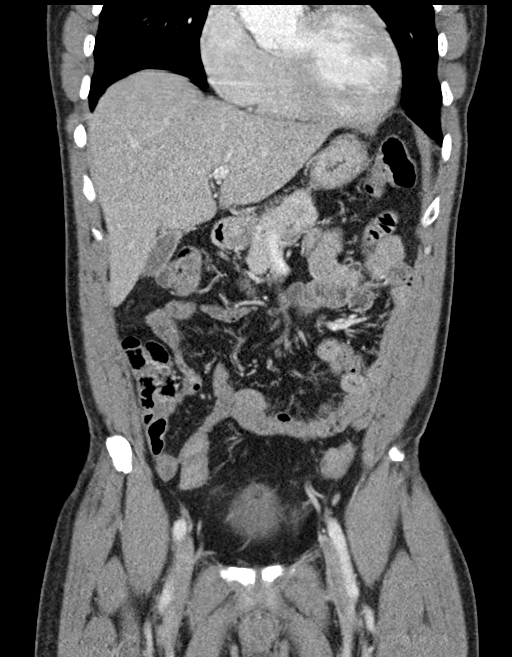
[im 42/94  soft-tissue]
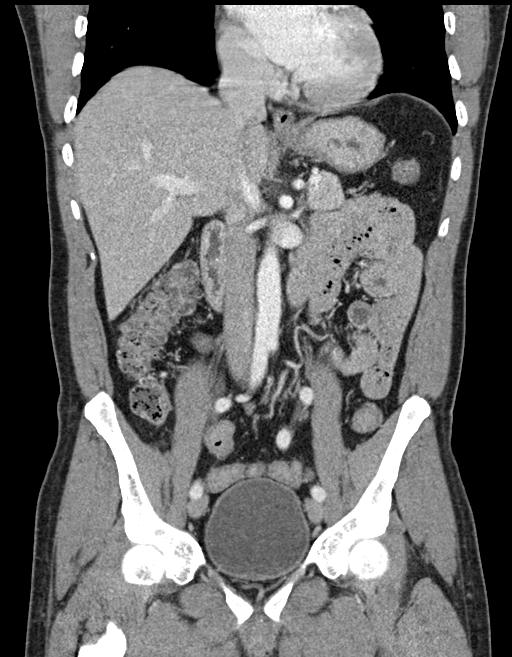
[im 52/94  soft-tissue]
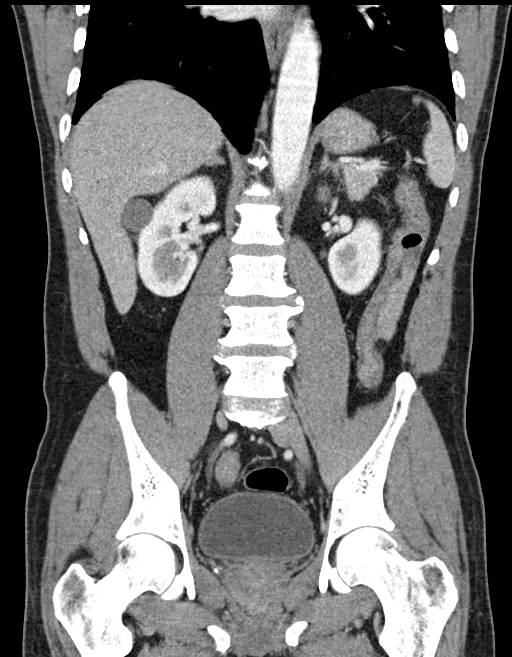

[15 of 46 positions shown; findings below may reference images not displayed]

FINDINGS: Lower chest: No acute abnormality.

Hepatobiliary: No focal liver abnormality is seen. No gallstones,
gallbladder wall thickening, or biliary dilatation.

Pancreas: Unremarkable. No pancreatic ductal dilatation or
surrounding inflammatory changes.

Spleen: Normal in size without focal abnormality.

Adrenals/Urinary Tract: Adrenal glands appear normal. 2.8 cm
hypodense mass exophytic to the lateral cortex of the RIGHT kidney,
with Hounsfield unit measurements of 33-34 on both portal venous
phase and delayed phase images.

No renal stone, hydronephrosis or perinephric inflammation
bilaterally. No ureteral or bladder calculi identified. Bladder
appears normal.

Stomach/Bowel: No dilated large or small bowel loops. No convincing
bowel wall thickening or evidence of bowel wall inflammation seen.
Appendix is normal. Stomach appears normal, partially decompressed.

Vascular/Lymphatic: Mild aortic atherosclerosis. No significant
vascular findings are present. No enlarged abdominal or pelvic lymph
nodes.

Reproductive: Prostate is unremarkable.

Other: No free fluid or abscess collection. No free intraperitoneal
air.

Musculoskeletal: Degenerative changes throughout the mid and lower
lumbar spine, mild to moderate in degree, possible associated nerve
root impingement at 1 or more levels. No acute or suspicious osseous
finding.
IMPRESSION: 1. **An incidental finding of potential clinical significance has
been found. Indeterminate mass within the RIGHT kidney, measuring
2.8 cm, with density measurements of 33-34 Hounsfield units on both
portal venous phase and delayed phase images. By CT density
measurements, this is not compatible with a benign simple cyst.
Neoplastic mass not excluded. Per consensus guidelines, recommend
nonemergent MRI with and without contrast or renal protocol CT with
and without contrast for further characterization.**
2. No acute findings. No bowel obstruction or convincing evidence of
bowel wall inflammation. No renal or ureteral calculi. No free fluid
or free intraperitoneal air. Appendix is normal.
3. Degenerative spondylosis of the mid and lower lumbar spine, mild
to moderate in degree, with associated osseous neural foramen
narrowings bilaterally at the L4-5 and L5-S1 levels.. If any
associated chronic low back pain and/or radiculopathic symptoms,
would consider nonemergent lumbar spine MRI to exclude associated
nerve root impingement.

## 2020-04-03 MED FILL — LOSARTAN POTASSIUM 25 MG TA: 25 | 30 days supply | Qty: 30 | Fill #5

## 2020-04-03 MED FILL — SILDENAFIL CITRATE 100 MG T: 100 | 30 days supply | Qty: 10 | Fill #4

## 2020-04-10 ENCOUNTER — Telehealth: Payer: Self-pay

## 2020-04-10 NOTE — Telephone Encounter (Signed)
Copied from [place] [id number]. Topic: General - Other >> Apr 08, 2020  2:19 PM Leward Quan A wrote: Reason for CRM: Patient called to say that from his visit on 04/07/20 he was charged twice because per his bank when card was swiped the transaction was paid but he was told it did not go through and he paid cash. So patient would like the extra fee to be credited to his next visit so he will not have to pay again $24 please advise. Ph# [phone]

## 2020-05-05 ENCOUNTER — Other Ambulatory Visit: Payer: Self-pay | Admitting: Nurse Practitioner

## 2020-05-05 DIAGNOSIS — I10 Essential (primary) hypertension: Secondary | ICD-10-CM

## 2020-05-05 MED FILL — LOSARTAN POTASSIUM 25 MG TA: 25 | 30 days supply | Qty: 30 | Fill #0

## 2020-05-05 MED FILL — SILDENAFIL CITRATE 100 MG T: 100 | 30 days supply | Qty: 10 | Fill #5

## 2020-06-08 ENCOUNTER — Other Ambulatory Visit: Payer: Self-pay | Admitting: Nurse Practitioner

## 2020-06-08 DIAGNOSIS — I10 Essential (primary) hypertension: Secondary | ICD-10-CM

## 2020-06-08 MED FILL — LOSARTAN POTASSIUM 25 MG TA: 25 | 30 days supply | Qty: 30 | Fill #0

## 2020-06-08 MED FILL — SILDENAFIL CITRATE 100 MG T: 100 | 30 days supply | Qty: 10 | Fill #6

## 2020-06-16 ENCOUNTER — Other Ambulatory Visit: Payer: Self-pay | Admitting: Nurse Practitioner

## 2020-06-16 DIAGNOSIS — I10 Essential (primary) hypertension: Secondary | ICD-10-CM

## 2020-06-16 NOTE — Telephone Encounter (Signed)
Requested medication (s) are due for refill today: yes   Requested medication (s) are on the active medication list: yes   Last refill:  03/16/2020  Future visit scheduled: yes   Notes to clinic: overdue for 6 month follow up    Requested Prescriptions  Pending Prescriptions Disp Refills   amLODipine (NORVASC) 10 MG tablet [Pharmacy Med Name: AMLODIPINE BESYLATE 10 MG T 10 Tablet] 90 tablet 1    Sig: Take 1 tablet (10 mg total) by mouth daily.      Cardiovascular:  Calcium Channel Blockers Failed - 06/16/2020  8:29 AM      Failed - Valid encounter within last 6 months    Recent Outpatient Visits           7 months ago Essential hypertension   Erik Erik Pounds, Erik Choi   9 months ago Essential hypertension   Erik Choi, Erik Choi   11 months ago Essential hypertension   Erik Choi, Erik Choi   11 months ago Encounter for annual physical exam   Erik Choi, Erik Buff, Erik Choi   1 year ago Essential hypertension   Erik Choi, Erik Choi, Erik Choi       Future Appointments             In 3 weeks Erik Pounds, Erik Choi [place] BP in normal range    BP Readings from Last 1 Encounters:  08/26/19 120/81

## 2020-06-17 MED FILL — AMLODIPINE BESYLATE 10 MG T: 10 | 30 days supply | Qty: 30 | Fill #0

## 2020-07-06 ENCOUNTER — Other Ambulatory Visit: Payer: Self-pay | Admitting: Nurse Practitioner

## 2020-07-06 ENCOUNTER — Other Ambulatory Visit: Payer: Self-pay | Admitting: Family Medicine

## 2020-07-06 DIAGNOSIS — I10 Essential (primary) hypertension: Secondary | ICD-10-CM

## 2020-07-06 DIAGNOSIS — N529 Male erectile dysfunction, unspecified: Secondary | ICD-10-CM

## 2020-07-07 MED FILL — LOSARTAN POTASSIUM 25 MG TA: 25 | 10 days supply | Qty: 10 | Fill #0

## 2020-07-07 MED FILL — SILDENAFIL CITRATE 100 MG T: 100 | 30 days supply | Qty: 10 | Fill #0

## 2020-07-13 ENCOUNTER — Encounter: Payer: Self-pay | Admitting: Nurse Practitioner

## 2020-07-13 ENCOUNTER — Other Ambulatory Visit: Payer: Self-pay | Admitting: Nurse Practitioner

## 2020-07-13 ENCOUNTER — Ambulatory Visit: Payer: Self-pay | Attending: Nurse Practitioner | Admitting: Nurse Practitioner

## 2020-07-13 ENCOUNTER — Other Ambulatory Visit: Payer: Self-pay

## 2020-07-13 VITALS — BP 136/89 | HR 89 | Temp 98.8°F | Ht 73.0 in | Wt 194.0 lb

## 2020-07-13 DIAGNOSIS — Z125 Encounter for screening for malignant neoplasm of prostate: Secondary | ICD-10-CM

## 2020-07-13 DIAGNOSIS — Z1211 Encounter for screening for malignant neoplasm of colon: Secondary | ICD-10-CM

## 2020-07-13 DIAGNOSIS — Z13220 Encounter for screening for lipoid disorders: Secondary | ICD-10-CM

## 2020-07-13 DIAGNOSIS — Z130 Encounter for screening for diseases of the blood and blood-forming organs and certain disorders involving the immune mechanism: Secondary | ICD-10-CM

## 2020-07-13 DIAGNOSIS — R509 Fever, unspecified: Secondary | ICD-10-CM

## 2020-07-13 DIAGNOSIS — R634 Abnormal weight loss: Secondary | ICD-10-CM

## 2020-07-13 DIAGNOSIS — Z1159 Encounter for screening for other viral diseases: Secondary | ICD-10-CM

## 2020-07-13 DIAGNOSIS — I10 Essential (primary) hypertension: Secondary | ICD-10-CM

## 2020-07-13 DIAGNOSIS — N2889 Other specified disorders of kidney and ureter: Secondary | ICD-10-CM

## 2020-07-13 DIAGNOSIS — Z114 Encounter for screening for human immunodeficiency virus [HIV]: Secondary | ICD-10-CM

## 2020-07-13 MED ORDER — ACETAMINOPHEN 500 MG PO TABS: 500.0000 mg | ORAL_TABLET | Freq: Four times a day (QID) | ORAL | 1 refills | Status: AC | PRN

## 2020-07-13 MED ORDER — LOSARTAN POTASSIUM 25 MG PO TABS: 25.0000 mg | ORAL_TABLET | Freq: Every day | ORAL | 1 refills | Status: DC

## 2020-07-13 MED ORDER — AMLODIPINE BESYLATE 10 MG PO TABS: 10.0000 mg | ORAL_TABLET | Freq: Every day | ORAL | 1 refills | Status: DC

## 2020-07-13 MED FILL — AMLODIPINE BESYLATE 10 MG T: 10 | 30 days supply | Qty: 30 | Fill #0

## 2020-07-13 NOTE — Progress Notes (Signed)
Assessment & Plan:  Erik Choi was seen today for medication refill.  Diagnoses and all orders for this visit:  Essential hypertension -     CMP14+EGFR -     losartan (COZAAR) 25 MG tablet; Take 1 tablet (25 mg total) by mouth daily. -     amLODipine (NORVASC) 10 MG tablet; Take 1 tablet (10 mg total) by mouth daily. Continue all antihypertensives as prescribed.  Remember to bring in your blood pressure log with you for your follow up appointment.  DASH/Mediterranean Diets are healthier choices for HTN.    Fever, unspecified fever cause -     DG Chest 2 View; Future -     acetaminophen (TYLENOL) 500 MG tablet; Take 1 tablet (500 mg total) by mouth every 6 (six) hours as needed.  Right renal mass -     MR Abdomen W Wo Contrast; Future  Weight loss, unintentional -     TSH -     DG Chest 2 View; Future  Need for hepatitis C screening test -     Hepatitis C Antibody  Encounter for screening for HIV -     HIV antibody (with reflex)  Colon cancer screening -     Fecal occult blood, imunochemical([facility])  Lipid screening -     Lipid panel  Screening for deficiency anemia -     CBC  Prostate cancer screening -     PSA    Patient has been counseled on age-appropriate routine health concerns for screening and prevention. These are reviewed and up-to-date. Referrals have been placed accordingly. Immunizations are up-to-date or declined.    Subjective:   Chief Complaint  Patient presents with  . Medication Refill    Pt. is here for medcation refill.    HPI Erik Choi 54 y.o. male presents to office today for follow up and medication refills.  States he is running fevers at night. Onset over 1 week ago. Associated symptoms: loss of appetite, unintentional weight loss and dry cough. Temperatures running 104 at night. He was out of town in [place] last week. Has not been tested for COVID.   Essential Hypertension Well controlled with amlodipine 10 mg daily  and losartan 25 mg daily. Denies chest pain, shortness of breath, palpitations, lightheadedness, dizziness, headaches or BLE edema.  BP Readings from Last 3 Encounters:  07/13/20 136/89  08/26/19 120/81  07/22/19 125/80    05-02-2018 CT abdomen: 1. An incidental finding of potential clinical significance has been found. Indeterminate mass within the RIGHT kidney, measuring 2.8 cm, with density measurements of 33-34 Hounsfield units on both portal venous phase and delayed phase images. By CT density measurements, this is not compatible with a benign simple cyst. Neoplastic mass not excluded. Per consensus guidelines, recommend nonemergent MRI with and without contrast or renal protocol CT with and without contrast for further characterization Endorsing fevers occurring at night.    Review of Systems  Constitutional: Positive for fever and weight loss. Negative for malaise/fatigue.  HENT: Negative.  Negative for nosebleeds.   Eyes: Negative.  Negative for blurred vision, double vision and photophobia.  Respiratory: Positive for cough. Negative for shortness of breath.   Cardiovascular: Negative.  Negative for chest pain, palpitations and leg swelling.  Gastrointestinal: Negative.  Negative for heartburn, nausea and vomiting.  Genitourinary: Negative.  Negative for dysuria, flank pain, frequency, hematuria and urgency.  Musculoskeletal: Negative.  Negative for myalgias.  Neurological: Negative.  Negative for dizziness, focal weakness, seizures and headaches.  Psychiatric/Behavioral: Negative.  Negative for suicidal ideas.    Past Medical History:  Diagnosis Date  . Erectile dysfunction   . Hypertension     History reviewed. No pertinent surgical history.  Family History  Problem Relation Age of Onset  . Cancer Neg Hx   . Diabetes Neg Hx     Social History Reviewed with no changes to be made today.   Outpatient Medications Prior to Visit  Medication Sig Dispense Refill    . Multiple Vitamin (MULTIVITAMIN WITH MINERALS) TABS tablet Take 1 tablet by mouth daily.    . sildenafil (VIAGRA) 100 MG tablet TAKE 1 TABLET (100 MG TOTAL) BY MOUTH DAILY AS NEEDED FOR ERECTILE DYSFUNCTION. 10 tablet 6  . amLODipine (NORVASC) 10 MG tablet TAKE 1 TABLET (10 MG TOTAL) BY MOUTH DAILY. 30 tablet 0  . losartan (COZAAR) 25 MG tablet Take 1 tablet (25 mg total) by mouth daily. APPOINTMENT NEEDED FOR ADDITIONAL REFILLS 10 tablet 0   No facility-administered medications prior to visit.    Allergies  Allergen Reactions  . Lisinopril   . Hctz [Hydrochlorothiazide] Other (See Comments)    Headache        Objective:    BP 136/89 (BP Location: Left Arm, Patient Position: Sitting, Cuff Size: Normal)   Pulse 89   Temp 98.8 F (37.1 C) (Temporal)   Ht '6\' 1"'  (1.854 m)   Wt 194 lb (88 kg)   SpO2 97%   BMI 25.60 kg/m  Wt Readings from Last 3 Encounters:  07/13/20 194 lb (88 kg)  07/01/19 204 lb (92.5 kg)  05/13/19 200 lb 9.6 oz (91 kg)    Physical Exam Vitals and nursing note reviewed.  Constitutional:      Appearance: He is well-developed.  HENT:     Head: Normocephalic and atraumatic.  Cardiovascular:     Rate and Rhythm: Normal rate and regular rhythm.     Heart sounds: Normal heart sounds. No murmur heard.  No friction rub. No gallop.   Pulmonary:     Effort: Pulmonary effort is normal. No tachypnea or respiratory distress.     Breath sounds: No decreased air movement. Examination of the right-lower field reveals decreased breath sounds. Decreased breath sounds present. No wheezing, rhonchi or rales.  Chest:     Chest wall: No tenderness.  Abdominal:     General: Bowel sounds are normal.     Palpations: Abdomen is soft.  Musculoskeletal:        General: Normal range of motion.     Cervical back: Normal range of motion.  Skin:    General: Skin is warm and dry.  Neurological:     Mental Status: He is alert and oriented to person, place, and time.      Coordination: Coordination normal.  Psychiatric:        Behavior: Behavior normal. Behavior is cooperative.        Thought Content: Thought content normal.        Judgment: Judgment normal.          Patient has been counseled extensively about nutrition and exercise as well as the importance of adherence with medications and regular follow-up. The patient was given clear instructions to go to ER or return to medical center if symptoms don't improve, worsen or new problems develop. The patient verbalized understanding.   Follow-up: Return in about 3 months (around 10/13/2020).   [name], FNP-BC [facility] [place], [place]  07/13/2020, 2:57 PM

## 2020-07-14 LAB — CMP14+EGFR
ALT: 20 IU/L (ref 0–44)
AST: 27 IU/L (ref 0–40)
Albumin/Globulin Ratio: 1.7 (ref 1.2–2.2)
Albumin: 4.7 g/dL (ref 3.8–4.9)
Alkaline Phosphatase: 107 IU/L (ref 44–121)
BUN/Creatinine Ratio: 8 — ABNORMAL LOW (ref 9–20)
BUN: 10 mg/dL (ref 6–24)
Bilirubin Total: 0.4 mg/dL (ref 0.0–1.2)
CO2: 25 mmol/L (ref 20–29)
Calcium: 9.2 mg/dL (ref 8.7–10.2)
Chloride: 101 mmol/L (ref 96–106)
Creatinine, Ser: 1.19 mg/dL (ref 0.76–1.27)
GFR calc Af Amer: 80 mL/min/{1.73_m2} (ref >59)
GFR calc non Af Amer: 69 mL/min/{1.73_m2} (ref >59)
Globulin, Total: 2.8 g/dL (ref 1.5–4.5)
Glucose: 111 mg/dL — ABNORMAL HIGH (ref 65–99)
Potassium: 4.6 mmol/L (ref 3.5–5.2)
Sodium: 137 mmol/L (ref 134–144)
Total Protein: 7.5 g/dL (ref 6.0–8.5)

## 2020-07-14 LAB — LIPID PANEL
Chol/HDL Ratio: 5.3 ratio — ABNORMAL HIGH (ref 0.0–5.0)
Cholesterol, Total: 148 mg/dL (ref 100–199)
HDL: 28 mg/dL — ABNORMAL LOW (ref >39)
LDL Chol Calc (NIH): 97 mg/dL (ref 0–99)
Triglycerides: 128 mg/dL (ref 0–149)
VLDL Cholesterol Cal: 23 mg/dL (ref 5–40)

## 2020-07-14 LAB — CBC
Hematocrit: 43.5 % (ref 37.5–51.0)
Hemoglobin: 14.5 g/dL (ref 13.0–17.7)
MCH: 28.9 pg (ref 26.6–33.0)
MCHC: 33.3 g/dL (ref 31.5–35.7)
MCV: 87 fL (ref 79–97)
Platelets: 234 10*3/uL (ref 150–450)
RBC: 5.02 x10E6/uL (ref 4.14–5.80)
RDW: 12.9 % (ref 11.6–15.4)
WBC: 3.6 10*3/uL (ref 3.4–10.8)

## 2020-07-14 LAB — TSH: TSH: 1.98 u[IU]/mL (ref 0.450–4.500)

## 2020-07-14 LAB — PSA: Prostate Specific Ag, Serum: 1.1 ng/mL (ref 0.0–4.0)

## 2020-07-14 LAB — HIV ANTIBODY (ROUTINE TESTING W REFLEX): HIV Screen 4th Generation wRfx: NONREACTIVE

## 2020-07-14 LAB — HEPATITIS C ANTIBODY: Hep C Virus Ab: <0.1 s/co ratio (ref 0.0–0.9)

## 2020-07-20 ENCOUNTER — Other Ambulatory Visit: Payer: Self-pay | Admitting: Nurse Practitioner

## 2020-07-20 ENCOUNTER — Other Ambulatory Visit: Payer: Self-pay | Admitting: Pharmacist

## 2020-07-20 ENCOUNTER — Other Ambulatory Visit: Payer: Self-pay | Admitting: Family Medicine

## 2020-07-20 DIAGNOSIS — I10 Essential (primary) hypertension: Secondary | ICD-10-CM

## 2020-07-20 MED ORDER — LOSARTAN POTASSIUM 25 MG PO TABS: 25.0000 mg | ORAL_TABLET | Freq: Every day | ORAL | 2 refills | Status: DC

## 2020-07-20 MED FILL — LOSARTAN POTASSIUM 25 MG TA: 25 | 30 days supply | Qty: 30 | Fill #0

## 2020-07-22 ENCOUNTER — Ambulatory Visit (HOSPITAL_COMMUNITY): Admission: RE | Admit: 2020-07-22 | Payer: Self-pay | Source: Ambulatory Visit

## 2020-07-24 LAB — FECAL OCCULT BLOOD, IMMUNOCHEMICAL: Fecal Occult Bld: NEGATIVE

## 2020-08-13 ENCOUNTER — Ambulatory Visit (HOSPITAL_COMMUNITY): Payer: Self-pay

## 2020-08-17 MED FILL — AMLODIPINE BESYLATE 10 MG T: 10 | 30 days supply | Qty: 30 | Fill #1

## 2020-08-17 MED FILL — SILDENAFIL CITRATE 100 MG T: 100 | 30 days supply | Qty: 10 | Fill #1

## 2020-08-17 MED FILL — LOSARTAN POTASSIUM 25 MG TA: 25 | 30 days supply | Qty: 30 | Fill #1

## 2020-08-18 ENCOUNTER — Other Ambulatory Visit: Payer: Self-pay

## 2020-08-18 ENCOUNTER — Ambulatory Visit (HOSPITAL_COMMUNITY)
Admission: RE | Admit: 2020-08-18 | Discharge: 2020-08-18 | Disposition: A | Discharge status: Home / Self Care | Payer: Self-pay | Source: Ambulatory Visit | Attending: Nurse Practitioner | Admitting: Nurse Practitioner

## 2020-08-18 DIAGNOSIS — N2889 Other specified disorders of kidney and ureter: Secondary | ICD-10-CM | POA: Insufficient documentation

## 2020-08-18 MED ORDER — GADOBUTROL 1 MMOL/ML IV SOLN
9.0000 mL | Freq: Once | INTRAVENOUS | Status: AC | PRN
  Administered 2020-08-18: 9 mL via INTRAVENOUS

## 2020-08-19 ENCOUNTER — Other Ambulatory Visit: Payer: Self-pay | Admitting: Nurse Practitioner

## 2020-08-19 DIAGNOSIS — N289 Disorder of kidney and ureter, unspecified: Secondary | ICD-10-CM

## 2020-09-15 ENCOUNTER — Other Ambulatory Visit: Payer: Self-pay | Admitting: Nurse Practitioner

## 2020-09-15 DIAGNOSIS — N289 Disorder of kidney and ureter, unspecified: Secondary | ICD-10-CM

## 2020-09-15 MED FILL — LOSARTAN POTASSIUM 25 MG TA: 25 | 30 days supply | Qty: 30 | Fill #2

## 2020-09-15 MED FILL — AMLODIPINE BESYLATE 10 MG T: 10 | 30 days supply | Qty: 30 | Fill #2

## 2020-09-15 MED FILL — SILDENAFIL CITRATE 100 MG T: 100 | 30 days supply | Qty: 10 | Fill #2

## 2020-09-15 NOTE — Progress Notes (Signed)
Urology

## 2020-10-13 ENCOUNTER — Other Ambulatory Visit: Payer: Self-pay | Admitting: Nurse Practitioner

## 2020-10-13 ENCOUNTER — Encounter: Payer: Self-pay | Admitting: Nurse Practitioner

## 2020-10-13 ENCOUNTER — Other Ambulatory Visit: Payer: Self-pay

## 2020-10-13 ENCOUNTER — Ambulatory Visit: Payer: Self-pay | Attending: Nurse Practitioner | Admitting: Nurse Practitioner

## 2020-10-13 DIAGNOSIS — G8929 Other chronic pain: Secondary | ICD-10-CM

## 2020-10-13 DIAGNOSIS — R109 Unspecified abdominal pain: Secondary | ICD-10-CM

## 2020-10-13 DIAGNOSIS — M545 Low back pain, unspecified: Secondary | ICD-10-CM

## 2020-10-13 DIAGNOSIS — I10 Essential (primary) hypertension: Secondary | ICD-10-CM

## 2020-10-13 MED ORDER — LOSARTAN POTASSIUM 25 MG PO TABS: 25.0000 mg | ORAL_TABLET | Freq: Every day | ORAL | 0 refills | Status: DC

## 2020-10-13 MED ORDER — CYCLOBENZAPRINE HCL 10 MG PO TABS: 10.0000 mg | ORAL_TABLET | Freq: Three times a day (TID) | ORAL | 1 refills | Status: DC | PRN

## 2020-10-13 MED ORDER — AMLODIPINE BESYLATE 10 MG PO TABS: 10.0000 mg | ORAL_TABLET | Freq: Every day | ORAL | 1 refills | Status: DC

## 2020-10-13 MED FILL — AMLODIPINE BESYLATE 10 MG T: 10 | 30 days supply | Qty: 30 | Fill #0

## 2020-10-13 MED FILL — LOSARTAN POTASSIUM 25 MG TA: 25 | 30 days supply | Qty: 30 | Fill #0

## 2020-10-13 MED FILL — CYCLOBENZAPRINE 10 MG TAB: 10 | 20 days supply | Qty: 60 | Fill #0

## 2020-10-13 NOTE — Progress Notes (Signed)
Virtual Visit via Telephone Note Due to national recommendations of social distancing due to Erik Choi, telehealth visit is felt to be most appropriate for this patient at this time.  I discussed the limitations, risks, security and privacy concerns of performing an evaluation and management service by telephone and the availability of in person appointments. I also discussed with the patient that there may be a patient responsible charge related to this service. The patient expressed understanding and agreed to proceed.    I connected with Erik Choi on 10/13/20  at   1:30 PM EST  EDT by telephone and verified that I am speaking with the correct person using two identifiers.   Consent I discussed the limitations, risks, security and privacy concerns of performing an evaluation and management service by telephone and the availability of in person appointments. I also discussed with the patient that there may be a patient responsible charge related to this service. The patient expressed understanding and agreed to proceed.   Location of Patient: Private Residence   Location of Provider: [place] participating in Telemedicine visit: [name] FNP-BC [place]    History of Present Illness: Telemedicine visit for: Follow up  has a past medical history of Erectile dysfunction and Hypertension.  He endorses constant right sided pain. Onset a few months. Denies dysuria, hematuria, pelvic pain or flank pain. Has one sexual partner and low risk for STD exposure.  I had referred him to urology for possible low grade renal cell carcinoma however today he states he has had a difficult time getting someone from their office to schedule him an appt.  MRI ABDOMEN 08-18-2020: Adrenals/Urinary Tract: No adrenal masses identified. A subcapsular lesion is seen in the lateral midpole the right kidney which measures 2.6 x 2.1 cm image 41/20,  without significant change in size since previous study. This lesion shows both T1 and T2 hypointensity, and with evidence of mild diffuse homogeneous contrast enhancement on subtraction imaging. 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 No other suspicious renal lesions are identified. No evidence of hydronephrosis.   Essential Hypertension Well controlled. Denies chest pain, shortness of breath, palpitations, lightheadedness, dizziness, headaches or BLE edema. Taking amlodipine 10 mg daily and losartan 25 mg daily as prescribed.   BP Readings from Last 3 Encounters:  07/13/20 136/89  08/26/19 120/81  07/22/19 125/80       Past Medical History:  Diagnosis Date  . Erectile dysfunction   . Hypertension     History reviewed. No pertinent surgical history.  Family History  Problem Relation Age of Onset  . Cancer Neg Hx   . Diabetes Neg Hx     Social History   Socioeconomic History  . Marital status: Married    Spouse name: Not on file  . Number of children: Not on file  . Years of education: Not on file  . Highest education level: Not on file  Occupational History  . Not on file  Tobacco Use  . Smoking status: Never Smoker  . Smokeless tobacco: Never Used  Vaping Use  . Vaping Use: Never used  Substance and Sexual Activity  . Alcohol use: Never  . Drug use: Never  . Sexual activity: Yes  Other Topics Concern  . Not on file  Social History Narrative  . Not on file   Social Determinants of Health   Financial Resource Strain: Not on file  Food  Insecurity: Not on file  Transportation Needs: Not on file  Physical Activity: Not on file  Stress: Not on file  Social Connections: Not on file     Observations/Objective: Awake, alert and oriented x 3   Review of Systems  Constitutional: Negative for fever, malaise/fatigue and weight loss.  HENT: Negative.  Negative for nosebleeds.   Eyes: Negative.  Negative for blurred vision, double  vision and photophobia.  Respiratory: Negative.  Negative for cough and shortness of breath.   Cardiovascular: Negative.  Negative for chest pain, palpitations and leg swelling.  Gastrointestinal: Positive for abdominal pain (SEE HPI). Negative for heartburn, nausea and vomiting.  Musculoskeletal: Positive for back pain (chronic low back ). Negative for myalgias.  Neurological: Negative.  Negative for dizziness, focal weakness, seizures and headaches.  Psychiatric/Behavioral: Negative.  Negative for suicidal ideas.    Assessment and Plan: Anson was seen today for side itches.  Diagnoses and all orders for this visit:  Side pain -     POCT URINALYSIS DIP (CLINITEK); Future  Scheduled with [facility] today by our office 11-17-2019 at 1:45pm He is aware of his appointment and upfront cost of $250  Essential hypertension -     amLODipine (NORVASC) 10 MG tablet; Take 1 tablet (10 mg total) by mouth daily. -     losartan (COZAAR) 25 MG tablet; Take 1 tablet (25 mg total) by mouth daily. -     CMP14+EGFR; Future  Chronic right-sided low back pain without sciatica -     cyclobenzaprine (FLEXERIL) 10 MG tablet; Take 1 tablet (10 mg total) by mouth 3 (three) times daily as needed for muscle spasms.     Follow Up Instructions Return in about 3 months (around 01/11/2021).     I discussed the assessment and treatment plan with the patient. The patient was provided an opportunity to ask questions and all were answered. The patient agreed with the plan and demonstrated an understanding of the instructions.   The patient was advised to call back or seek an in-person evaluation if the symptoms worsen or if the condition fails to improve as anticipated.  I provided 15 minutes of non-face-to-face time during this encounter including median intraservice time, reviewing previous notes, labs, imaging, medications and explaining diagnosis and management.  [name], FNP-BC

## 2020-10-15 MED FILL — SILDENAFIL CITRATE 100 MG T: 100 | 30 days supply | Qty: 10 | Fill #3

## 2020-10-19 ENCOUNTER — Ambulatory Visit: Payer: Self-pay | Attending: Nurse Practitioner

## 2020-10-19 ENCOUNTER — Other Ambulatory Visit: Payer: Self-pay

## 2020-10-19 DIAGNOSIS — I10 Essential (primary) hypertension: Secondary | ICD-10-CM

## 2020-10-19 DIAGNOSIS — R109 Unspecified abdominal pain: Secondary | ICD-10-CM

## 2020-10-19 LAB — POCT URINALYSIS DIP (CLINITEK)
Bilirubin, UA: NEGATIVE
Blood, UA: NEGATIVE
Glucose, UA: NEGATIVE mg/dL
Ketones, POC UA: NEGATIVE mg/dL
Leukocytes, UA: NEGATIVE
Nitrite, UA: NEGATIVE
POC PROTEIN,UA: NEGATIVE
Spec Grav, UA: 1.025 (ref 1.010–1.025)
Urobilinogen, UA: 0.2 E.U./dL
pH, UA: 6 (ref 5.0–8.0)

## 2020-10-20 LAB — CMP14+EGFR
ALT: 20 IU/L (ref 0–44)
AST: 21 IU/L (ref 0–40)
Albumin/Globulin Ratio: 1.7 (ref 1.2–2.2)
Albumin: 4.8 g/dL (ref 3.8–4.9)
Alkaline Phosphatase: 117 IU/L (ref 44–121)
BUN/Creatinine Ratio: 15 (ref 9–20)
BUN: 16 mg/dL (ref 6–24)
Bilirubin Total: 0.3 mg/dL (ref 0.0–1.2)
CO2: 25 mmol/L (ref 20–29)
Calcium: 9.4 mg/dL (ref 8.7–10.2)
Chloride: 103 mmol/L (ref 96–106)
Creatinine, Ser: 1.1 mg/dL (ref 0.76–1.27)
GFR calc Af Amer: 88 mL/min/{1.73_m2} (ref >59)
GFR calc non Af Amer: 76 mL/min/{1.73_m2} (ref >59)
Globulin, Total: 2.8 g/dL (ref 1.5–4.5)
Glucose: 92 mg/dL (ref 65–99)
Potassium: 4.4 mmol/L (ref 3.5–5.2)
Sodium: 138 mmol/L (ref 134–144)
Total Protein: 7.6 g/dL (ref 6.0–8.5)

## 2020-11-16 MED FILL — AMLODIPINE BESYLATE 10 MG T: 10 | 30 days supply | Qty: 30 | Fill #1

## 2020-11-16 MED FILL — SILDENAFIL CITRATE 100 MG T: 100 | 30 days supply | Qty: 10 | Fill #4

## 2020-11-16 MED FILL — LOSARTAN POTASSIUM 25 MG TA: 25 | 30 days supply | Qty: 30 | Fill #1

## 2020-12-14 MED FILL — AMLODIPINE BESYLATE 10 MG T: 10 | 30 days supply | Qty: 30 | Fill #2

## 2020-12-14 MED FILL — LOSARTAN POTASSIUM 25 MG TA: 25 | 30 days supply | Qty: 30 | Fill #2

## 2020-12-14 MED FILL — SILDENAFIL CITRATE 100 MG T: 100 | 30 days supply | Qty: 10 | Fill #5

## 2021-01-11 ENCOUNTER — Other Ambulatory Visit: Payer: Self-pay

## 2021-01-11 ENCOUNTER — Encounter: Payer: Self-pay | Admitting: Nurse Practitioner

## 2021-01-11 ENCOUNTER — Ambulatory Visit: Payer: Self-pay | Attending: Nurse Practitioner | Admitting: Nurse Practitioner

## 2021-01-11 DIAGNOSIS — G8929 Other chronic pain: Secondary | ICD-10-CM

## 2021-01-11 DIAGNOSIS — I10 Essential (primary) hypertension: Secondary | ICD-10-CM

## 2021-01-11 DIAGNOSIS — M545 Low back pain, unspecified: Secondary | ICD-10-CM

## 2021-01-11 DIAGNOSIS — N529 Male erectile dysfunction, unspecified: Secondary | ICD-10-CM

## 2021-01-11 MED ORDER — AMLODIPINE BESYLATE 10 MG PO TABS
10.0000 mg | ORAL_TABLET | Freq: Every day | ORAL | 1 refills | Status: DC
  Filled 2021-01-11: qty 30, 30d supply, fill #0
  Filled 2021-01-12: qty 60, 60d supply, fill #1

## 2021-01-11 MED ORDER — LOSARTAN POTASSIUM 25 MG PO TABS
25.0000 mg | ORAL_TABLET | Freq: Every day | ORAL | 0 refills | Status: DC
  Filled 2021-01-11: qty 30, 30d supply, fill #0
  Filled 2021-01-12: qty 60, 60d supply, fill #1

## 2021-01-11 MED ORDER — MELOXICAM 7.5 MG PO TABS
7.5000 mg | ORAL_TABLET | Freq: Every day | ORAL | 3 refills | Status: DC
  Filled 2021-01-11: qty 10, 30d supply, fill #0
  Filled 2021-01-11: qty 30, 30d supply, fill #0
  Filled 2021-01-12: qty 60, 60d supply, fill #1

## 2021-01-11 MED ORDER — SILDENAFIL CITRATE 100 MG PO TABS
100.0000 mg | ORAL_TABLET | Freq: Every day | ORAL | 6 refills | Status: DC | PRN
  Filled 2021-01-11: qty 10, 30d supply, fill #0

## 2021-01-11 NOTE — Progress Notes (Signed)
Assessment & Plan:  Erik Choi was seen today for hypertension.  Diagnoses and all orders for this visit:  Essential hypertension -     amLODipine (NORVASC) 10 MG tablet; Take 1 tablet (10 mg total) by mouth daily. Please fill as a 90 day supply -     losartan (COZAAR) 25 MG tablet; Take 1 tablet (25 mg total) by mouth daily. Please fill as a 90 day supply  Chronic right-sided low back pain without sciatica -     meloxicam (MOBIC) 7.5 MG tablet; Take 1 tablet (7.5 mg total) by mouth daily.  Erectile dysfunction, unspecified erectile dysfunction type -     sildenafil (VIAGRA) 100 MG tablet; Take 1 tablet (100 mg total) by mouth daily as needed for erectile dysfunction.    Patient has been counseled on age-appropriate routine health concerns for screening and prevention. These are reviewed and up-to-date. Referrals have been placed accordingly. Immunizations are up-to-date or declined.    Subjective:   Chief Complaint  Patient presents with  . Hypertension   HPI Erik Choi 55 y.o. male presents to office today for follow up.  has a past medical history of Erectile dysfunction and Hypertension. Being followed by Urology for right renal mass.   Essential Hypertension He had his blood pressure monitor with him today with readings as follows:  127/74 118/72 117/73 And appears her blood pressure is only elevated when he comes for his office visits.  He is taking amlodipine 10 mg daily and losartan 25 mg daily as prescribed.  Based on home readings I will not make any adjustments with his current blood pressure medications.  Denies chest pain, shortness of breath, palpitations, lightheadedness, dizziness, headaches or BLE edema.   BP Readings from Last 3 Encounters:  01/11/21 (!) 153/89  07/13/20 136/89  08/26/19 120/81   Review of Systems  Constitutional: Negative for fever, malaise/fatigue and weight loss.  HENT: Negative.  Negative for nosebleeds.   Eyes: Negative.  Negative  for blurred vision, double vision and photophobia.  Respiratory: Negative.  Negative for cough and shortness of breath.   Cardiovascular: Negative.  Negative for chest pain, palpitations and leg swelling.  Gastrointestinal: Negative.  Negative for heartburn, nausea and vomiting.  Genitourinary:       ED taking Viagra  Musculoskeletal: Positive for back pain (Well-controlled with NSAIDs). Negative for myalgias.  Neurological: Negative.  Negative for dizziness, focal weakness, seizures and headaches.  Psychiatric/Behavioral: Negative.  Negative for suicidal ideas.    Past Medical History:  Diagnosis Date  . Erectile dysfunction   . Hypertension     History reviewed. No pertinent surgical history.  Family History  Problem Relation Age of Onset  . Cancer Neg Hx   . Diabetes Neg Hx     Social History Reviewed with no changes to be made today.   Outpatient Medications Prior to Visit  Medication Sig Dispense Refill  . cyclobenzaprine (FLEXERIL) 10 MG tablet Take 1 tablet (10 mg total) by mouth 3 (three) times daily as needed for muscle spasms. 60 tablet 1  . Multiple Vitamin (MULTIVITAMIN WITH MINERALS) TABS tablet Take 1 tablet by mouth daily.    Marland Kitchen amLODipine (NORVASC) 10 MG tablet Take 1 tablet (10 mg total) by mouth daily. 90 tablet 1  . losartan (COZAAR) 25 MG tablet Take 1 tablet (25 mg total) by mouth daily. 90 tablet 0  . sildenafil (VIAGRA) 100 MG tablet TAKE 1 TABLET (100 MG TOTAL) BY MOUTH DAILY AS NEEDED FOR ERECTILE DYSFUNCTION.  10 tablet 6   No facility-administered medications prior to visit.    Allergies  Allergen Reactions  . Lisinopril   . Hctz [Hydrochlorothiazide] Other (See Comments)    Headache        Objective:    BP (!) 153/89   Pulse (!) 58   Resp 16   Ht 6\' 1"  (1.854 m)   Wt 204 lb 3.2 oz (92.6 kg)   SpO2 99%   BMI 26.94 kg/m  Wt Readings from Last 3 Encounters:  01/11/21 204 lb 3.2 oz (92.6 kg)  07/13/20 194 lb (88 kg)  07/01/19 204 lb  (92.5 kg)    Physical Exam Vitals and nursing note reviewed.  Constitutional:      Appearance: He is well-developed.  HENT:     Head: Normocephalic and atraumatic.  Cardiovascular:     Rate and Rhythm: Regular rhythm. Bradycardia present.     Heart sounds: Normal heart sounds. No murmur heard. No friction rub. No gallop.   Pulmonary:     Effort: Pulmonary effort is normal. No tachypnea or respiratory distress.     Breath sounds: Normal breath sounds. No decreased breath sounds, wheezing, rhonchi or rales.  Chest:     Chest wall: No tenderness.  Abdominal:     General: Bowel sounds are normal.     Palpations: Abdomen is soft.  Musculoskeletal:        General: Normal range of motion.     Cervical back: Normal range of motion.  Skin:    General: Skin is warm and dry.  Neurological:     Mental Status: He is alert and oriented to person, place, and time.     Coordination: Coordination normal.  Psychiatric:        Behavior: Behavior normal. Behavior is cooperative.        Thought Content: Thought content normal.        Judgment: Judgment normal.          Patient has been counseled extensively about nutrition and exercise as well as the importance of adherence with medications and regular follow-up. The patient was given clear instructions to go to ER or return to medical center if symptoms don't improve, worsen or new problems develop. The patient verbalized understanding.   Follow-up: Return in about 3 months (around 04/12/2021).   [name], FNP-BC [facility] [place], [place]   01/11/2021, 3:28 PM

## 2021-01-11 NOTE — Progress Notes (Signed)
Pt would also like refills for two months.

## 2021-01-12 ENCOUNTER — Other Ambulatory Visit: Payer: Self-pay

## 2021-01-13 ENCOUNTER — Other Ambulatory Visit: Payer: Self-pay

## 2021-01-19 ENCOUNTER — Other Ambulatory Visit: Payer: Self-pay

## 2021-03-25 ENCOUNTER — Other Ambulatory Visit: Payer: Self-pay | Admitting: Urology

## 2021-03-29 ENCOUNTER — Other Ambulatory Visit: Payer: Self-pay | Admitting: Urology

## 2021-04-13 ENCOUNTER — Encounter: Payer: Self-pay | Admitting: Nurse Practitioner

## 2021-04-13 ENCOUNTER — Other Ambulatory Visit: Payer: Self-pay

## 2021-04-13 ENCOUNTER — Ambulatory Visit (HOSPITAL_BASED_OUTPATIENT_CLINIC_OR_DEPARTMENT_OTHER): Payer: Self-pay | Admitting: Nurse Practitioner

## 2021-04-13 DIAGNOSIS — N529 Male erectile dysfunction, unspecified: Secondary | ICD-10-CM

## 2021-04-13 DIAGNOSIS — I10 Essential (primary) hypertension: Secondary | ICD-10-CM

## 2021-04-13 DIAGNOSIS — M545 Low back pain, unspecified: Secondary | ICD-10-CM

## 2021-04-13 DIAGNOSIS — N289 Disorder of kidney and ureter, unspecified: Secondary | ICD-10-CM

## 2021-04-13 DIAGNOSIS — G8929 Other chronic pain: Secondary | ICD-10-CM

## 2021-04-13 MED ORDER — AMLODIPINE BESYLATE 10 MG PO TABS
10.0000 mg | ORAL_TABLET | Freq: Every day | ORAL | 1 refills | Status: DC
  Filled 2021-04-13: qty 90, 90d supply, fill #0
  Filled 2021-07-11: qty 90, 90d supply, fill #1

## 2021-04-13 MED ORDER — LOSARTAN POTASSIUM 25 MG PO TABS
25.0000 mg | ORAL_TABLET | Freq: Every day | ORAL | 1 refills | Status: DC
  Filled 2021-04-13: qty 90, 90d supply, fill #0
  Filled 2021-07-11: qty 90, 90d supply, fill #1

## 2021-04-13 MED ORDER — CYCLOBENZAPRINE HCL 10 MG PO TABS
10.0000 mg | ORAL_TABLET | Freq: Three times a day (TID) | ORAL | 2 refills | Status: DC | PRN
Start: 2021-04-13 — End: 2022-07-13
  Filled 2021-04-13: qty 60, 20d supply, fill #0
  Filled 2021-10-06: qty 60, 20d supply, fill #1

## 2021-04-13 MED ORDER — SILDENAFIL CITRATE 100 MG PO TABS
100.0000 mg | ORAL_TABLET | Freq: Every day | ORAL | 6 refills | Status: DC | PRN
  Filled 2021-04-13 – 2022-01-10 (×2): qty 10, 30d supply, fill #0
  Filled 2022-04-13: qty 10, 30d supply, fill #1

## 2021-04-13 NOTE — Progress Notes (Signed)
Virtual Visit via Telephone Note Due to national recommendations of social distancing due to [place] 19, telehealth visit is felt to be most appropriate for this patient at this time.  I discussed the limitations, risks, security and privacy concerns of performing an evaluation and management service by telephone and the availability of in person appointments. I also discussed with the patient that there may be a patient responsible charge related to this service. The patient expressed understanding and agreed to proceed.    I connected with Erik Choi on 04/13/21  at   2:10 PM EDT  EDT by telephone and verified that I am speaking with the correct person using two identifiers.  Location of Patient: Private Residence   Location of Provider: [place] participating in Telemedicine visit: [name] FNP-BC Erik Choi    History of Present Illness: Telemedicine visit for: HTN He has partial nephrectomy scheduled for August. Needs [facility]. I sent address via his my chart.   Blood pressure well controlled at home. 110-120/70-80. Denies chest pain, shortness of breath, palpitations, lightheadedness, dizziness, headaches or BLE edema.  Needs refill on losartan 25 mg and amlodipine 10 mg daily. Denies chest pain, shortness of breath, palpitations, lightheadedness, dizziness, headaches or BLE edema.   BP Readings from Last 3 Encounters:  01/11/21 (!) 153/89  07/13/20 136/89  08/26/19 120/81     Past Medical History:  Diagnosis Date   Erectile dysfunction    Hypertension     History reviewed. No pertinent surgical history.  Family History  Problem Relation Age of Onset   Cancer Neg Hx    Diabetes Neg Hx     Social History   Socioeconomic History   Marital status: Married    Spouse name: Not on file   Number of children: Not on file   Years of education: Not on file   Highest education level: Not on file   Occupational History   Not on file  Tobacco Use   Smoking status: Never   Smokeless tobacco: Never  Vaping Use   Vaping Use: Never used  Substance and Sexual Activity   Alcohol use: Never   Drug use: Never   Sexual activity: Yes  Other Topics Concern   Not on file  Social History Narrative   Not on file   Social Determinants of Health   Financial Resource Strain: Not on file  Food Insecurity: Not on file  Transportation Needs: Not on file  Physical Activity: Not on file  Stress: Not on file  Social Connections: Not on file     Observations/Objective: Awake, alert and oriented x 3   Review of Systems  Constitutional:  Negative for fever, malaise/fatigue and weight loss.  HENT: Negative.  Negative for nosebleeds.   Eyes: Negative.  Negative for blurred vision, double vision and photophobia.  Respiratory: Negative.  Negative for cough and shortness of breath.   Cardiovascular: Negative.  Negative for chest pain, palpitations and leg swelling.  Gastrointestinal: Negative.  Negative for heartburn, nausea and vomiting.  Musculoskeletal: Negative.  Negative for myalgias.  Neurological: Negative.  Negative for dizziness, focal weakness, seizures and headaches.  Psychiatric/Behavioral: Negative.  Negative for suicidal ideas.    Assessment and Plan: Diagnoses and all orders for this visit:  Primary hypertension -     CMP14+EGFR; Future -     losartan (COZAAR) 25 MG tablet; Take 1 tablet (25 mg total) by mouth daily. Please fill as  a 90 day supply -     amLODipine (NORVASC) 10 MG tablet; Take 1 tablet (10 mg total) by mouth daily. Please fill as a 90 day supply Continue all antihypertensives as prescribed.  Remember to bring in your blood pressure log with you for your follow up appointment.  DASH/Mediterranean Diets are healthier choices for HTN.    Lesion of right native kidney -     CBC; Future  Erectile dysfunction, unspecified erectile dysfunction type -      sildenafil (VIAGRA) 100 MG tablet; Take 1 tablet (100 mg total) by mouth daily as needed for erectile dysfunction.  Chronic right-sided low back pain without sciatica -     cyclobenzaprine (FLEXERIL) 10 MG tablet; Take 1 tablet (10 mg total) by mouth 3 (three) times daily as needed for muscle spasms.    Follow Up Instructions Return in about 3 months (around 07/14/2021).     I discussed the assessment and treatment plan with the patient. The patient was provided an opportunity to ask questions and all were answered. The patient agreed with the plan and demonstrated an understanding of the instructions.   The patient was advised to call back or seek an in-person evaluation if the symptoms worsen or if the condition fails to improve as anticipated.  I provided10 minutes of non-face-to-face time during this encounter including median intraservice time, reviewing previous notes, labs, imaging, medications and explaining diagnosis and management.  [name], FNP-BC

## 2021-04-14 ENCOUNTER — Other Ambulatory Visit: Payer: Self-pay

## 2021-04-14 NOTE — Addendum Note (Signed)
Addended bySteffanie Dunn on: 04/14/2021 09:07 AM   Modules accepted: Orders

## 2021-04-15 LAB — CMP14+EGFR
ALT: 18 IU/L (ref 0–44)
AST: 20 IU/L (ref 0–40)
Albumin/Globulin Ratio: 1.6 (ref 1.2–2.2)
Albumin: 4.5 g/dL (ref 3.8–4.9)
Alkaline Phosphatase: 136 IU/L — ABNORMAL HIGH (ref 44–121)
BUN/Creatinine Ratio: 11 (ref 9–20)
BUN: 11 mg/dL (ref 6–24)
Bilirubin Total: 0.3 mg/dL (ref 0.0–1.2)
CO2: 25 mmol/L (ref 20–29)
Calcium: 9.6 mg/dL (ref 8.7–10.2)
Chloride: 100 mmol/L (ref 96–106)
Creatinine, Ser: 1.02 mg/dL (ref 0.76–1.27)
Globulin, Total: 2.9 g/dL (ref 1.5–4.5)
Glucose: 75 mg/dL (ref 65–99)
Potassium: 3.9 mmol/L (ref 3.5–5.2)
Sodium: 138 mmol/L (ref 134–144)
Total Protein: 7.4 g/dL (ref 6.0–8.5)
eGFR: 87 mL/min/{1.73_m2} (ref >59)

## 2021-04-15 LAB — CBC
Hematocrit: 38.7 % (ref 37.5–51.0)
Hemoglobin: 12.7 g/dL — ABNORMAL LOW (ref 13.0–17.7)
MCH: 28.6 pg (ref 26.6–33.0)
MCHC: 32.8 g/dL (ref 31.5–35.7)
MCV: 87 fL (ref 79–97)
Platelets: 301 10*3/uL (ref 150–450)
RBC: 4.44 x10E6/uL (ref 4.14–5.80)
RDW: 13.3 % (ref 11.6–15.4)
WBC: 4.5 10*3/uL (ref 3.4–10.8)

## 2021-05-17 NOTE — Patient Instructions (Signed)
DUE TO COVID-19 ONLY ONE VISITOR IS ALLOWED TO COME WITH YOU AND STAY IN THE WAITING ROOM ONLY DURING PRE OP AND PROCEDURE DAY OF SURGERY. THE 2 VISITORS  MAY VISIT WITH YOU AFTER SURGERY IN YOUR PRIVATE ROOM DURING VISITING HOURS ONLY!  YOU NEED TO HAVE A COVID 19 TEST ON__8/29_____ '@_______'$ , THIS TEST MUST BE DONE BEFORE SURGERY,                Erik Choi    Your procedure is scheduled on: 06/09/21   Report to [facility] Main  Entrance   Report to admitting at  10:15 AM     Call this number if you have problems the morning of surgery [phone]    Remember: Do not eat food after Midnight.  You may have clear liquids until 9:15 am   BRUSH YOUR TEETH MORNING OF SURGERY AND RINSE YOUR MOUTH OUT, NO CHEWING GUM CANDY OR MINTS.     Take these medicines the morning of surgery with A SIP OF WATER: Amlodipine                                You may not have any metal on your body including              piercings  Do not wear jewelry, lotions, powders or deodorant                       Men may shave face and neck.   Do not bring valuables to the hospital. [place].  Contacts, dentures or bridgework may not be worn into surgery.      Special Instructions: N/A              Please read over the following fact sheets you were given: _____________________________________________________________________             [facility] - Preparing for Surgery Before surgery, you can play an important role.  Because skin is not sterile, your skin needs to be as free of germs as possible.  You can reduce the number of germs on your skin by washing with CHG (chlorahexidine gluconate) soap before surgery.  CHG is an antiseptic cleaner which kills germs and bonds with the skin to continue killing germs even after washing. Please DO NOT use if you have an allergy to CHG or antibacterial soaps.  If your skin becomes reddened/irritated stop  using the CHG and inform your nurse when you arrive at Short Stay.   You may shave your face/neck.  Please follow these instructions carefully:  1.  Shower with CHG Soap the night before surgery and the  morning of Surgery.  2.  If you choose to wash your hair, wash your hair first as usual with your  normal  shampoo.  3.  After you shampoo, rinse your hair and body thoroughly to remove the  shampoo.                                        4.  Use CHG as you would any other liquid soap.  You can apply chg directly  to the skin and wash  Gently with a scrungie or clean washcloth.  5.  Apply the CHG Soap to your body ONLY FROM THE NECK DOWN.   Do not use on face/ open                           Wound or open sores. Avoid contact with eyes, ears mouth and genitals (private parts).                       Wash face,  Genitals (private parts) with your normal soap.             6.  Wash thoroughly, paying special attention to the area where your surgery  will be performed.  7.  Thoroughly rinse your body with warm water from the neck down.  8.  DO NOT shower/wash with your normal soap after using and rinsing off  the CHG Soap.             9.  Pat yourself dry with a clean towel.            10.  Wear clean pajamas.            11.  Place clean sheets on your bed the night of your first shower and do not  sleep with pets. Day of Surgery : Do not apply any lotions/deodorants the morning of surgery.  Please wear clean clothes to the hospital/surgery center.  FAILURE TO FOLLOW THESE INSTRUCTIONS MAY RESULT IN THE CANCELLATION OF YOUR SURGERY PATIENT SIGNATURE_________________________________  NURSE SIGNATURE__________________________________  ________________________________________________________________________

## 2021-05-18 ENCOUNTER — Encounter (HOSPITAL_COMMUNITY)
Admission: RE | Admit: 2021-05-18 | Discharge: 2021-05-18 | Disposition: A | Discharge status: Home / Self Care | Payer: Self-pay | Source: Ambulatory Visit | Attending: Urology | Admitting: Urology

## 2021-05-18 ENCOUNTER — Encounter (HOSPITAL_COMMUNITY): Payer: Self-pay

## 2021-05-18 ENCOUNTER — Other Ambulatory Visit: Payer: Self-pay

## 2021-05-18 DIAGNOSIS — Z01818 Encounter for other preprocedural examination: Secondary | ICD-10-CM | POA: Insufficient documentation

## 2021-05-18 LAB — CBC
HCT: 40.5 % (ref 39.0–52.0)
Hemoglobin: 13.3 g/dL (ref 13.0–17.0)
MCH: 28.7 pg (ref 26.0–34.0)
MCHC: 32.8 g/dL (ref 30.0–36.0)
MCV: 87.3 fL (ref 80.0–100.0)
Platelets: 292 10*3/uL (ref 150–400)
RBC: 4.64 MIL/uL (ref 4.22–5.81)
RDW: 12.8 % (ref 11.5–15.5)
WBC: 4.3 10*3/uL (ref 4.0–10.5)
nRBC: 0 % (ref 0.0–0.2)

## 2021-05-18 LAB — BASIC METABOLIC PANEL
Anion gap: 6 (ref 5–15)
BUN: 15 mg/dL (ref 6–20)
CO2: 29 mmol/L (ref 22–32)
Calcium: 9.3 mg/dL (ref 8.9–10.3)
Chloride: 105 mmol/L (ref 98–111)
Creatinine, Ser: 1.09 mg/dL (ref 0.61–1.24)
GFR, Estimated: >60 mL/min (ref >60)
Glucose, Bld: 71 mg/dL (ref 70–99)
Potassium: 3.5 mmol/L (ref 3.5–5.1)
Sodium: 140 mmol/L (ref 135–145)

## 2021-05-18 NOTE — Progress Notes (Signed)
COVID Vaccine Completed:yes Date COVID Vaccine completed:pt got 2 shots COVID vaccine manufacturer: Pfizer     PCP - Dr. Earnest Conroy. [place] LOV 04/13/21 Cardiologist - none  Chest x-ray - no EKG - 05/18/21-chart Stress Test - no ECHO - no Cardiac Cath - no Pacemaker/ICD device last checked:NA  Sleep Study - no CPAP -   Fasting Blood Sugar - NA Checks Blood Sugar _____ times a day  Blood Thinner Instructions:NA Aspirin Instructions: Last Dose:  Anesthesia review: no  Patient denies shortness of breath, fever, cough and chest pain at PAT appointment Pt works out 2-3 times a week no SOB with activities.   Patient verbalized understanding of instructions that were given to them at the PAT appointment. Patient was also instructed that they will need to review over the PAT instructions again at home before surgery. yes

## 2021-06-07 ENCOUNTER — Other Ambulatory Visit: Payer: Self-pay | Admitting: Urology

## 2021-06-08 LAB — SARS CORONAVIRUS 2 (TAT 6-24 HRS): SARS Coronavirus 2: NEGATIVE

## 2021-06-09 ENCOUNTER — Encounter (HOSPITAL_COMMUNITY): Admission: RE | Payer: Self-pay | Source: Ambulatory Visit

## 2021-06-09 ENCOUNTER — Inpatient Hospital Stay (HOSPITAL_COMMUNITY): Admission: RE | Admit: 2021-06-09 | Payer: Self-pay | Source: Ambulatory Visit | Admitting: Urology

## 2021-06-09 SURGERY — NEPHRECTOMY, PARTIAL, ROBOT-ASSISTED
Anesthesia: General | Laterality: Right

## 2021-06-10 ENCOUNTER — Telehealth: Payer: Self-pay | Admitting: Nurse Practitioner

## 2021-06-10 NOTE — Telephone Encounter (Signed)
Copied from [place] [id number]. Topic: General - Other >> Jun 08, 2021  9:03 AM Tessa Lerner A wrote: Reason for CRM: Patient would like to be contacted by their PCP regarding care coordination   The patient will be unable to make an appt with their oncologist tomorrow 06/09/21 and has been told by their oncologists office that if they miss the appt they'll be unable to remain a patient   The patient would like to discuss being referred somewhere else and also discuss their treatment further with a member of clinical staff when possible

## 2021-06-11 ENCOUNTER — Encounter: Payer: Self-pay | Admitting: Nurse Practitioner

## 2021-06-11 ENCOUNTER — Other Ambulatory Visit: Payer: Self-pay | Admitting: Nurse Practitioner

## 2021-06-11 DIAGNOSIS — N289 Disorder of kidney and ureter, unspecified: Secondary | ICD-10-CM

## 2021-06-11 NOTE — Telephone Encounter (Signed)
Erik Choi are there any other options for referral to a different urology office or no?

## 2021-06-11 NOTE — Telephone Encounter (Signed)
Pt states that he wants to have a second opinion and requesting new referral to be placed.

## 2021-06-28 NOTE — Progress Notes (Signed)
06/29/21 3:39 PM   Erik Choi 01-15-1966 [id number]  Referring provider:  [name], NP [place],  [place] [address] Chief Complaint  Patient presents with   New Patient (Initial Visit)     HPI: Erik Choi is a 55 y.o.male who presents today for further evaluation of lesion on right kidney.   He has a personal history of right renal mass on right mid anterior measuring 2.6 cm in 2021. 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   Lesion was first identified on a CT scan in 2019.  There is been little interval growth between his MR in 2021 and 2019.  He was scheduled for a XI robotic assisted partial nephrectomy but cancelled and was seeking a second opinion.  He reports that he canceled the surgery right before with little notice and they were unable to get it rescheduled.  He is now reconsidering his options.  He is doing well today. He reports a family history of cancer  He also has a history of erectile dysfunction and is on sildenafil at baseline.   PMH: Past Medical History:  Diagnosis Date   Cancer ([place]) 2019   renal   Erectile dysfunction    Hypertension 2019    Surgical History: Past Surgical History:  Procedure Laterality Date   HAND SURGERY Left    55 yo    Home Medications:  Allergies as of 06/29/2021       Reactions   Lisinopril    Hctz [hydrochlorothiazide] Other (See Comments)   Headache        Medication List        Accurate as of June 29, 2021  3:39 PM. If you have any questions, ask your nurse or doctor.          amLODipine 10 MG tablet Commonly known as: NORVASC Take 1 tablet (10 mg total) by mouth daily. Please fill as a 90 day supply   cyclobenzaprine 10 MG tablet Commonly known as: FLEXERIL Take 1 tablet (10 mg total) by mouth 3 (three) times daily as needed for muscle spasms.   hydroxypropyl methylcellulose / hypromellose 2.5 % ophthalmic  solution Commonly known as: ISOPTO TEARS / GONIOVISC Place 1 drop into both eyes daily.   losartan 25 MG tablet Commonly known as: COZAAR Take 1 tablet (25 mg total) by mouth daily. Please fill as a 90 day supply   multivitamin with minerals Tabs tablet Take 1 tablet by mouth daily.   sildenafil 100 MG tablet Commonly known as: VIAGRA Take 1 tablet (100 mg total) by mouth daily as needed for erectile dysfunction.        Allergies:  Allergies  Allergen Reactions   Lisinopril    Hctz [Hydrochlorothiazide] Other (See Comments)    Headache     Family History: Family History  Problem Relation Age of Onset   Cancer Neg Hx    Diabetes Neg Hx     Social History:  reports that he has never smoked. He has never used smokeless tobacco. He reports that he does not drink alcohol and does not use drugs.   Physical Exam: BP (!) 153/94   Pulse 66   Ht '6\' 1"'$  (1.854 m)   Wt 200 lb (90.7 kg)   BMI 26.39 kg/m   Constitutional:  Alert and oriented, No acute distress. HEENT: [place] AT, moist mucus membranes.  Trachea midline, no masses. Cardiovascular: No clubbing, cyanosis, or edema. Respiratory: Normal respiratory effort,  no increased work of breathing. Skin: No rashes, bruises or suspicious lesions. Neurologic: Grossly intact, no focal deficits, moving all 4 extremities. Psychiatric: Normal mood and affect.  Laboratory Data:  Lab Results  Component Value Date   CREATININE 1.09 05/18/2021     Lab Results  Component Value Date   HGBA1C 5.3 05/24/2018    Pertinent Imaging: CLINICAL DATA:  Follow-up indeterminate right renal lesion.   EXAM: MRI ABDOMEN WITHOUT AND WITH CONTRAST   TECHNIQUE: Multiplanar multisequence MR imaging of the abdomen was performed both before and after the administration of intravenous contrast.   CONTRAST:  14m GADAVIST GADOBUTROL 1 MMOL/ML IV SOLN   COMPARISON:  CT on 05/02/2018   FINDINGS: Lower chest: No acute findings.    Hepatobiliary: No hepatic masses identified. Gallbladder is unremarkable. No evidence of biliary ductal dilatation.   Pancreas:  No mass or inflammatory changes.   Spleen:  Within normal limits in size and appearance.   Adrenals/Urinary Tract: No adrenal masses identified. A subcapsular lesion is seen in the lateral midpole the right kidney which measures 2.6 x 2.1 cm image 41/20, without significant change in size since previous study. This lesion shows both T1 and T2 hypointensity, and with evidence of mild diffuse homogeneous contrast enhancement on subtraction imaging. 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 No other suspicious renal lesions are identified. No evidence of hydronephrosis.   Stomach/Bowel: Visualized portion unremarkable.   Vascular/Lymphatic: No pathologically enlarged lymph nodes identified. No abdominal aortic aneurysm.   Other:  None.   Musculoskeletal:  No suspicious bone lesions identified.   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   No evidence of abdominal metastatic disease or other significant abnormality.     Electronically Signed   By: JMarlaine HindM.D.   On: 08/18/2020 12:40.   I have personally reviewed the images and agree with radiologist interpretation.  This is also compared to his CT scan from 04/2018 and agree with radiologic interpretation.  There is been minimal interval growth between these 2 studies.  Assessment & Plan:    Right renal mass A solid renal mass raises the suspicion of primary renal malignancy.  We discussed this in detail and in regards to the spectrum of renal masses which includes cysts (pure cysts are considered benign), solid masses and everything in between. The risk of metastasis increases as the size of solid renal mass increases. In  general, it is believed that the risk of metastasis for renal masses less than 3-4 cm is small (up to approximately 5%) based mainly on large retrospective studies. In some cases and especially in patients of older age and multiple comorbidities a surveillance approach may be appropriate. The treatment of solid renal masses includes: surveillance, cryoablation (percutaneous and laparoscopic) in addition to partial and complete nephrectomy (each with option of laparoscopic, robotic and open depending on appropriateness). Furthermore, nephrectomy appears to be an independent risk factor for the development of chronic kidney disease suggesting that nephron sparing approaches should be implored whenever feasible. We reviewed these options in context of the patients current situation as well as the pros and cons of each.  Given that he has had no recent imaging, would recommend either an MRI or CT scan to assess for further interval growth as he has not had a study in over 10 months.  We discussed whether not  to pursue MRI versus CT scan.  For cost-effective reasons, we will get a CT of the abdomen with contrast, not interested in the features necessarily as this lesion has been characterized rather looking to assess growth rate  We discussed various options is likely previously outlined by Dr. Bess Harvest.  This would include surgical excision which is an excellent option especially given his fairly young age and relatively good health.  Alternatively, could consider cryoablation.  Lastly, observation was also discussed.  If there is been no interval growth, would also strongly recommend consideration of biopsy of the lesion.  He is agreeable to plan.  2.  Erectile dysfunction, vasculogenic Continue sildenafil as needed  F/u CT scan  I,Kailey Littlejohn,acting as a scribe for Hollice Espy, MD.,have documented all relevant documentation on the behalf of Hollice Espy, MD,as directed by  Hollice Espy, MD  while in the presence of Hollice Espy, MD.  I spent 45 total minutes on the day of the encounter including pre-visit review of the medical record, face-to-face time with the patient, and post visit ordering of labs/imaging/tests.   Robert Lee [address] [place], [place] [address] [phone]

## 2021-06-29 ENCOUNTER — Ambulatory Visit (INDEPENDENT_AMBULATORY_CARE_PROVIDER_SITE_OTHER): Payer: Self-pay | Admitting: Urology

## 2021-06-29 ENCOUNTER — Other Ambulatory Visit: Payer: Self-pay

## 2021-06-29 ENCOUNTER — Encounter: Payer: Self-pay | Admitting: Urology

## 2021-06-29 VITALS — BP 153/94 | HR 66 | Ht 73.0 in | Wt 200.0 lb

## 2021-06-29 DIAGNOSIS — N2889 Other specified disorders of kidney and ureter: Secondary | ICD-10-CM

## 2021-06-29 DIAGNOSIS — N289 Disorder of kidney and ureter, unspecified: Secondary | ICD-10-CM

## 2021-06-29 DIAGNOSIS — N5203 Combined arterial insufficiency and corporo-venous occlusive erectile dysfunction: Secondary | ICD-10-CM

## 2021-06-30 LAB — MICROSCOPIC EXAMINATION: Bacteria, UA: NONE SEEN

## 2021-06-30 LAB — URINALYSIS, COMPLETE
Bilirubin, UA: NEGATIVE
Glucose, UA: NEGATIVE
Ketones, UA: NEGATIVE
Leukocytes,UA: NEGATIVE
Nitrite, UA: NEGATIVE
Protein,UA: NEGATIVE
RBC, UA: NEGATIVE
Specific Gravity, UA: 1.02 (ref 1.005–1.030)
Urobilinogen, Ur: 0.2 mg/dL (ref 0.2–1.0)
pH, UA: 6.5 (ref 5.0–7.5)

## 2021-07-12 ENCOUNTER — Encounter: Payer: Self-pay | Admitting: Nurse Practitioner

## 2021-07-12 ENCOUNTER — Other Ambulatory Visit: Payer: Self-pay

## 2021-07-13 ENCOUNTER — Ambulatory Visit: Payer: Self-pay | Attending: Nurse Practitioner | Admitting: Nurse Practitioner

## 2021-07-13 ENCOUNTER — Other Ambulatory Visit: Payer: Self-pay

## 2021-07-13 ENCOUNTER — Telehealth: Payer: Self-pay | Admitting: Nurse Practitioner

## 2021-07-13 NOTE — Telephone Encounter (Signed)
No answer LVM to return call to office [phone]

## 2021-07-26 NOTE — Progress Notes (Signed)
07/27/21 2:41 PM   Darin Engels [birth year] [id number]  Referring provider:  [name], NP [place],  [place] [address] Chief Complaint  Patient presents with   Results     HPI: Erik Choi is a 55 y.o.male with a personal history of right renal mass and erectile dysfunction, who presents today for 1 month follow-up with CT prior.   He has a personal history of right renal mass on right mid anterior measuring 2.6 cm in 2021. 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  This lesion was first diagnosed in 2019 and had remained stable in this 2-year interval.  Originally, he had scheduled surgery but ended up canceling.   CT scan today revealed no change in size of renal mass, stable at 1.0 x 1.6 cm, unchanged from previous original CT scan in 2019.  He is doing well today with no new urinary symptoms.    PMH: Past Medical History:  Diagnosis Date   Cancer ([place]) 2019   renal   Erectile dysfunction    Hypertension 2019    Surgical History: Past Surgical History:  Procedure Laterality Date   HAND SURGERY Left    55 yo    Home Medications:  Allergies as of 07/27/2021       Reactions   Lisinopril    Hctz [hydrochlorothiazide] Other (See Comments)   Headache        Medication List        Accurate as of July 27, 2021  2:41 PM. If you have any questions, ask your nurse or doctor.          amLODipine 10 MG tablet Commonly known as: NORVASC Take 1 tablet (10 mg total) by mouth daily. Please fill as a 90 day supply   cyclobenzaprine 10 MG tablet Commonly known as: FLEXERIL Take 1 tablet (10 mg total) by mouth 3 (three) times daily as needed for muscle spasms.   hydroxypropyl methylcellulose / hypromellose 2.5 % ophthalmic solution Commonly known as: ISOPTO TEARS / GONIOVISC Place 1 drop into both eyes daily.   losartan 25 MG tablet Commonly known as: COZAAR Take 1 tablet (25 mg total) by  mouth daily. Please fill as a 90 day supply   multivitamin with minerals Tabs tablet Take 1 tablet by mouth daily.   sildenafil 100 MG tablet Commonly known as: VIAGRA Take 1 tablet (100 mg total) by mouth daily as needed for erectile dysfunction.        Allergies:  Allergies  Allergen Reactions   Lisinopril    Hctz [Hydrochlorothiazide] Other (See Comments)    Headache     Family History: Family History  Problem Relation Age of Onset   Cancer Neg Hx    Diabetes Neg Hx     Social History:  reports that he has never smoked. He has never used smokeless tobacco. He reports that he does not drink alcohol and does not use drugs.   Physical Exam: BP (!) 157/90   Pulse 90   Ht 6\' 1"  (1.854 m)   Wt 200 lb (90.7 kg)   BMI 26.39 kg/m   Constitutional:  Alert and oriented, No acute distress. HEENT: [place] AT, moist mucus membranes.  Trachea midline, no masses. Cardiovascular: No clubbing, cyanosis, or edema. Respiratory: Normal respiratory effort, no increased work of breathing. Skin: No rashes, bruises or suspicious lesions. Neurologic: Grossly intact, no focal deficits, moving all 4 extremities. Psychiatric: Normal mood and affect.  Laboratory  Data:  Lab Results  Component Value Date   CREATININE 1.20 07/27/2021    Lab Results  Component Value Date   HGBA1C 5.3 05/24/2018    CT scan performed earlier this morning was personally reviewed by me.  We are waiting radiologic interpretation however it measured the right renal mass myself and there is been no interval growth or change.     Assessment & Plan:    Right renal mass  -Overall stability from 2019 without any interval growth is suggestive of either benign process or very indolent malignancy  - We discussed various options including biopsy along with risk and benefits of this versus continued serial imaging.  At this time, I do not feel that surgery is necessary or warranted.  He like to avoid this if possible  and agrees with this.  He would like to continue surveillance imaging.  We will try to de-escalate the imaging modality to a renal ultrasound.  We will plan for this in 1 year given his overall stability.  Follow-up with [facility]  I,Kailey Littlejohn,acting as a scribe for Hollice Espy, MD.,have documented all relevant documentation on the behalf of Hollice Espy, MD,as directed by  Hollice Espy, MD while in the presence of Hollice Espy, MD.  I have reviewed the above documentation for accuracy and completeness, and I agree with the above.   Hollice Espy, MD  [facility] [address] [place], [place] [address] [phone]

## 2021-07-27 ENCOUNTER — Ambulatory Visit (HOSPITAL_COMMUNITY)
Admission: RE | Admit: 2021-07-27 | Discharge: 2021-07-27 | Disposition: A | Discharge status: Home / Self Care | Payer: Self-pay | Source: Ambulatory Visit | Attending: Urology | Admitting: Urology

## 2021-07-27 ENCOUNTER — Encounter (HOSPITAL_COMMUNITY): Payer: Self-pay

## 2021-07-27 ENCOUNTER — Other Ambulatory Visit: Payer: Self-pay

## 2021-07-27 ENCOUNTER — Encounter: Payer: Self-pay | Admitting: Urology

## 2021-07-27 ENCOUNTER — Ambulatory Visit (INDEPENDENT_AMBULATORY_CARE_PROVIDER_SITE_OTHER): Payer: Self-pay | Admitting: Urology

## 2021-07-27 VITALS — BP 157/90 | HR 90 | Ht 73.0 in | Wt 200.0 lb

## 2021-07-27 DIAGNOSIS — N289 Disorder of kidney and ureter, unspecified: Secondary | ICD-10-CM

## 2021-07-27 DIAGNOSIS — N2889 Other specified disorders of kidney and ureter: Secondary | ICD-10-CM | POA: Insufficient documentation

## 2021-07-27 LAB — POCT I-STAT CREATININE: Creatinine, Ser: 1.2 mg/dL (ref 0.61–1.24)

## 2021-07-27 MED ORDER — IOHEXOL 350 MG/ML SOLN
75.0000 mL | Freq: Once | INTRAVENOUS | Status: AC | PRN
  Administered 2021-07-27: 75 mL via INTRAVENOUS

## 2021-10-06 ENCOUNTER — Other Ambulatory Visit: Payer: Self-pay | Admitting: Nurse Practitioner

## 2021-10-06 DIAGNOSIS — I10 Essential (primary) hypertension: Secondary | ICD-10-CM

## 2021-10-06 MED ORDER — LOSARTAN POTASSIUM 25 MG PO TABS
25.0000 mg | ORAL_TABLET | Freq: Every day | ORAL | 0 refills | Status: DC
  Filled 2021-10-06: qty 90, 90d supply, fill #0

## 2021-10-06 MED ORDER — AMLODIPINE BESYLATE 10 MG PO TABS
10.0000 mg | ORAL_TABLET | Freq: Every day | ORAL | 0 refills | Status: DC
  Filled 2021-10-06: qty 90, 90d supply, fill #0

## 2021-10-06 MED ORDER — ADULT MULTIVITAMIN W/MINERALS CH
1.0000 | ORAL_TABLET | Freq: Every day | ORAL | 6 refills | Status: AC
  Filled 2021-10-06 – 2022-09-05 (×4): qty 30, 30d supply, fill #0
  Filled ????-??-??: fill #0

## 2021-10-06 MED ORDER — HYPROMELLOSE (GONIOSCOPIC) 2.5 % OP SOLN
1.0000 [drp] | Freq: Every day | OPHTHALMIC | 0 refills | Status: DC
  Filled 2021-10-06: qty 15, 90d supply, fill #0

## 2021-10-06 NOTE — Telephone Encounter (Signed)
Requested Prescriptions  Pending Prescriptions Disp Refills   losartan (COZAAR) 25 MG tablet 90 tablet 1    Sig: Take 1 tablet (25 mg total) by mouth daily. Please fill as a 90 day supply     Cardiovascular:  Angiotensin Receptor Blockers Failed - 10/06/2021  7:42 PM      Failed - Last BP in normal range    BP Readings from Last 1 Encounters:  07/27/21 (!) 157/90         Passed - Cr in normal range and within 180 days    Creatinine, Ser  Date Value Ref Range Status  07/27/2021 1.20 0.61 - 1.24 mg/dL Final         Passed - K in normal range and within 180 days    Potassium  Date Value Ref Range Status  05/18/2021 3.5 3.5 - 5.1 mmol/L Final         Passed - Patient is not pregnant      Passed - Valid encounter within last 6 months    Recent Outpatient Visits          5 months ago Primary hypertension   [place], Vernia Buff, NP   8 months ago Essential hypertension   [place], Vernia Buff, NP   11 months ago Side pain   [place], Vernia Buff, NP   1 year ago Essential hypertension   [place], Vernia Buff, NP   1 year ago Essential hypertension   [place], Zelda W, NP      Future Appointments            In 1 month [name], NP [place]   In 9 months Hollice Espy, MD [facility]            amLODipine (NORVASC) 10 MG tablet 90 tablet 1    Sig: Take 1 tablet (10 mg total) by mouth daily. Please fill as a 90 day supply     Cardiovascular:  Calcium Channel Blockers Failed - 10/06/2021  7:42 PM      Failed - Last BP in normal range    BP Readings from Last 1 Encounters:  07/27/21 (!) 157/90         Passed - Valid encounter within last 6 months    Recent Outpatient Visits          5 months ago Primary  hypertension   [place], Vernia Buff, NP   8 months ago Essential hypertension   [place], Vernia Buff, NP   11 months ago Side pain   [place], Vernia Buff, NP   1 year ago Essential hypertension   [place], Vernia Buff, NP   1 year ago Essential hypertension   [place], Zelda W, NP      Future Appointments            In 1 month [name], NP [place]   In 9 months Hollice Espy, MD [place]

## 2021-10-07 ENCOUNTER — Other Ambulatory Visit: Payer: Self-pay

## 2021-10-11 ENCOUNTER — Other Ambulatory Visit: Payer: Self-pay

## 2021-10-12 ENCOUNTER — Other Ambulatory Visit: Payer: Self-pay

## 2021-11-15 ENCOUNTER — Encounter: Payer: Self-pay | Admitting: Nurse Practitioner

## 2021-11-22 ENCOUNTER — Encounter: Payer: Self-pay | Admitting: Nurse Practitioner

## 2021-11-22 ENCOUNTER — Other Ambulatory Visit: Payer: Self-pay

## 2021-11-22 ENCOUNTER — Ambulatory Visit: Payer: Self-pay | Attending: Nurse Practitioner | Admitting: Nurse Practitioner

## 2021-11-22 VITALS — BP 148/88 | HR 68 | Ht 73.0 in | Wt 202.1 lb

## 2021-11-22 DIAGNOSIS — D649 Anemia, unspecified: Secondary | ICD-10-CM

## 2021-11-22 DIAGNOSIS — E785 Hyperlipidemia, unspecified: Secondary | ICD-10-CM

## 2021-11-22 DIAGNOSIS — I10 Essential (primary) hypertension: Secondary | ICD-10-CM

## 2021-11-22 DIAGNOSIS — N529 Male erectile dysfunction, unspecified: Secondary | ICD-10-CM

## 2021-11-22 DIAGNOSIS — Z0000 Encounter for general adult medical examination without abnormal findings: Secondary | ICD-10-CM

## 2021-11-22 DIAGNOSIS — H04123 Dry eye syndrome of bilateral lacrimal glands: Secondary | ICD-10-CM

## 2021-11-22 MED ORDER — AMLODIPINE BESYLATE 10 MG PO TABS
10.0000 mg | ORAL_TABLET | Freq: Every day | ORAL | 1 refills | Status: DC
  Filled 2021-11-22 – 2022-01-10 (×2): qty 90, 90d supply, fill #0
  Filled 2022-04-13: qty 90, 90d supply, fill #1

## 2021-11-22 MED ORDER — LOSARTAN POTASSIUM 25 MG PO TABS
25.0000 mg | ORAL_TABLET | Freq: Every day | ORAL | 1 refills | Status: DC
  Filled 2021-11-22 – 2022-01-10 (×2): qty 90, 90d supply, fill #0
  Filled 2022-04-13: qty 90, 90d supply, fill #1

## 2021-11-22 MED ORDER — HYPROMELLOSE (GONIOSCOPIC) 2.5 % OP SOLN
1.0000 [drp] | Freq: Every day | OPHTHALMIC | 1 refills | Status: DC
  Filled 2021-11-22 – 2022-04-13 (×2): qty 15, 300d supply, fill #0
  Filled 2022-07-06: qty 15, 150d supply, fill #0
  Filled 2022-07-11: qty 15, 300d supply, fill #0

## 2021-11-22 NOTE — Progress Notes (Signed)
Assessment & Plan:  Erik Choi was seen today for annual exam.  Diagnoses and all orders for this visit:  Encounter for annual physical exam  Primary hypertension -     amLODipine (NORVASC) 10 MG tablet; Take 1 tablet (10 mg total) by mouth daily. Please fill as a 90 day supply -     losartan (COZAAR) 25 MG tablet; Take 1 tablet (25 mg total) by mouth daily. Please fill as a 90 day supply -     CMP14+EGFR  Anemia, unspecified type -     CBC with Differential  Dyslipidemia, goal LDL below 100 -     Lipid panel  Dry eyes -     hydroxypropyl methylcellulose / hypromellose (ISOPTO TEARS / GONIOVISC) 2.5 % ophthalmic solution; Place 1 drop into both eyes daily.    Patient has been counseled on age-appropriate routine health concerns for screening and prevention. These are reviewed and up-to-date. Referrals have been placed accordingly. Immunizations are up-to-date or declined.    Subjective:   Chief Complaint  Patient presents with   Annual Exam   HPI Erik Choi 56 y.o. male presents to office today for annual physical exam. He has a past medical history of right renal mass suspicious for low grade papillary renal cell carcinoma,  Erectile dysfunction, and Hypertension (2019).  Followed by Urology  Review of Systems  Constitutional:  Negative for fever, malaise/fatigue and weight loss.  HENT: Negative.  Negative for nosebleeds.   Eyes: Negative.  Negative for blurred vision, double vision and photophobia.  Respiratory: Negative.  Negative for cough and shortness of breath.   Cardiovascular: Negative.  Negative for chest pain, palpitations and leg swelling.  Gastrointestinal: Negative.  Negative for heartburn, nausea and vomiting.  Genitourinary: Negative.   Musculoskeletal: Negative.  Negative for myalgias.  Skin: Negative.   Neurological: Negative.  Negative for dizziness, focal weakness, seizures and headaches.  Endo/Heme/Allergies: Negative.   Psychiatric/Behavioral:  Negative.  Negative for suicidal ideas.    Past Medical History:  Diagnosis Date   Cancer [facility]) 2019   renal   Erectile dysfunction    Hypertension 2019    Past Surgical History:  Procedure Laterality Date   HAND SURGERY Left    56 yo    Family History  Problem Relation Age of Onset   Cancer Neg Hx    Diabetes Neg Hx     Social History Reviewed with no changes to be made today.   Outpatient Medications Prior to Visit  Medication Sig Dispense Refill   cyclobenzaprine (FLEXERIL) 10 MG tablet Take 1 tablet (10 mg total) by mouth 3 (three) times daily as needed for muscle spasms. 60 tablet 2   Multiple Vitamin (MULTIVITAMIN WITH MINERALS) TABS tablet Take 1 tablet by mouth daily. 30 tablet 6   amLODipine (NORVASC) 10 MG tablet Take 1 tablet (10 mg total) by mouth daily. Please fill as a 90 day supply 90 tablet 0   hydroxypropyl methylcellulose / hypromellose (ISOPTO TEARS / GONIOVISC) 2.5 % ophthalmic solution Place 1 drop into both eyes daily. 15 mL 0   losartan (COZAAR) 25 MG tablet Take 1 tablet (25 mg total) by mouth daily. Please fill as a 90 day supply 90 tablet 0   sildenafil (VIAGRA) 100 MG tablet Take 1 tablet (100 mg total) by mouth daily as needed for erectile dysfunction. (Patient not taking: Reported on 11/22/2021) 20 tablet 6   No facility-administered medications prior to visit.    Allergies  Allergen Reactions  Lisinopril    Hctz [Hydrochlorothiazide] Other (See Comments)    Headache        Objective:    BP (!) 148/88    Pulse 68    Ht '6\' 1"'  (1.854 m)    Wt 202 lb 2 oz (91.7 kg)    SpO2 99%    BMI 26.67 kg/m  Wt Readings from Last 3 Encounters:  11/22/21 202 lb 2 oz (91.7 kg)  07/27/21 200 lb (90.7 kg)  06/29/21 200 lb (90.7 kg)    Physical Exam Constitutional:      Appearance: He is well-developed.  HENT:     Head: Normocephalic and atraumatic.     Right Ear: Hearing, tympanic membrane, ear canal and external ear normal.     Left Ear: Hearing,  tympanic membrane, ear canal and external ear normal.     Nose: Nose normal. No mucosal edema or rhinorrhea.     Right Turbinates: Not enlarged.     Left Turbinates: Not enlarged.     Mouth/Throat:     Lips: Pink.     Mouth: Mucous membranes are moist.     Dentition: No gingival swelling, dental abscesses or gum lesions.     Pharynx: Uvula midline.     Tonsils: No tonsillar exudate. 1+ on the right. 1+ on the left.  Eyes:     General: Lids are normal. No scleral icterus.    Extraocular Movements: Extraocular movements intact.     Conjunctiva/sclera: Conjunctivae normal.     Pupils: Pupils are equal, round, and reactive to light.  Neck:     Thyroid: No thyromegaly.     Trachea: No tracheal deviation.  Cardiovascular:     Rate and Rhythm: Normal rate and regular rhythm.     Heart sounds: Normal heart sounds. No murmur heard.   No friction rub. No gallop.  Pulmonary:     Effort: Pulmonary effort is normal. No respiratory distress.     Breath sounds: Normal breath sounds. No wheezing or rales.  Chest:     Chest wall: No mass or tenderness.  Breasts:    Right: No inverted nipple, mass, nipple discharge, skin change or tenderness.     Left: No inverted nipple, mass, nipple discharge, skin change or tenderness.  Abdominal:     General: Bowel sounds are normal. There is no distension.     Palpations: Abdomen is soft. There is no mass.     Tenderness: There is no abdominal tenderness. There is no guarding or rebound.  Musculoskeletal:        General: No tenderness or deformity. Normal range of motion.     Cervical back: Normal range of motion and neck supple.  Lymphadenopathy:     Cervical: No cervical adenopathy.  Skin:    General: Skin is warm and dry.     Capillary Refill: Capillary refill takes less than 2 seconds.     Findings: No erythema.  Neurological:     Mental Status: He is alert and oriented to person, place, and time.     Cranial Nerves: No cranial nerve deficit.      Sensory: Sensation is intact.     Motor: No abnormal muscle tone.     Coordination: Coordination is intact. Coordination normal.     Gait: Gait is intact.     Deep Tendon Reflexes: Reflexes normal.     Reflex Scores:      Patellar reflexes are 1+ on the right side and 1+ on the  left side. Psychiatric:        Attention and Perception: Attention normal.        Mood and Affect: Mood normal.        Speech: Speech normal.        Behavior: Behavior normal.        Thought Content: Thought content normal.        Judgment: Judgment normal.         Patient has been counseled extensively about nutrition and exercise as well as the importance of adherence with medications and regular follow-up. The patient was given clear instructions to go to ER or return to medical center if symptoms don't improve, worsen or new problems develop. The patient verbalized understanding.   Follow-up: Return in about 3 months (around 02/19/2022).   [name], FNP-BC [facility] [place], [place]   11/23/2021, 8:10 PM

## 2021-11-23 ENCOUNTER — Encounter: Payer: Self-pay | Admitting: Nurse Practitioner

## 2021-11-23 LAB — LIPID PANEL

## 2021-11-24 LAB — CMP14+EGFR
ALT: 13 IU/L (ref 0–44)
AST: 16 IU/L (ref 0–40)
Albumin/Globulin Ratio: 1.8 (ref 1.2–2.2)
Albumin: 4.7 g/dL (ref 3.8–4.9)
Alkaline Phosphatase: 124 IU/L — ABNORMAL HIGH (ref 44–121)
BUN/Creatinine Ratio: 10 (ref 9–20)
BUN: 12 mg/dL (ref 6–24)
Bilirubin Total: 0.2 mg/dL (ref 0.0–1.2)
CO2: 22 mmol/L (ref 20–29)
Calcium: 9.7 mg/dL (ref 8.7–10.2)
Chloride: 104 mmol/L (ref 96–106)
Creatinine, Ser: 1.15 mg/dL (ref 0.76–1.27)
Globulin, Total: 2.6 g/dL (ref 1.5–4.5)
Glucose: 80 mg/dL (ref 70–99)
Potassium: 4.3 mmol/L (ref 3.5–5.2)
Sodium: 142 mmol/L (ref 134–144)
Total Protein: 7.3 g/dL (ref 6.0–8.5)
eGFR: 76 mL/min/{1.73_m2} (ref >59)

## 2021-11-24 LAB — LIPID PANEL
Chol/HDL Ratio: 4.5 ratio (ref 0.0–5.0)
Cholesterol, Total: 168 mg/dL (ref 100–199)
HDL: 37 mg/dL — ABNORMAL LOW (ref >39)
LDL Chol Calc (NIH): 103 mg/dL — ABNORMAL HIGH (ref 0–99)
Triglycerides: 159 mg/dL — ABNORMAL HIGH (ref 0–149)
VLDL Cholesterol Cal: 28 mg/dL (ref 5–40)

## 2021-11-24 LAB — CBC WITH DIFFERENTIAL/PLATELET
Basophils Absolute: 0 10*3/uL (ref 0.0–0.2)
Basos: 1 %
EOS (ABSOLUTE): 0.1 10*3/uL (ref 0.0–0.4)
Eos: 3 %
Hematocrit: 39.2 % (ref 37.5–51.0)
Hemoglobin: 12.9 g/dL — ABNORMAL LOW (ref 13.0–17.7)
Immature Grans (Abs): 0 10*3/uL (ref 0.0–0.1)
Immature Granulocytes: 0 %
Lymphocytes Absolute: 2 10*3/uL (ref 0.7–3.1)
Lymphs: 47 %
MCH: 28.2 pg (ref 26.6–33.0)
MCHC: 32.9 g/dL (ref 31.5–35.7)
MCV: 86 fL (ref 79–97)
Monocytes Absolute: 0.4 10*3/uL (ref 0.1–0.9)
Monocytes: 9 %
Neutrophils Absolute: 1.7 10*3/uL (ref 1.4–7.0)
Neutrophils: 40 %
Platelets: 278 10*3/uL (ref 150–450)
RBC: 4.58 x10E6/uL (ref 4.14–5.80)
RDW: 13.1 % (ref 11.6–15.4)
WBC: 4.2 10*3/uL (ref 3.4–10.8)

## 2021-11-30 ENCOUNTER — Other Ambulatory Visit: Payer: Self-pay

## 2022-01-10 ENCOUNTER — Other Ambulatory Visit: Payer: Self-pay

## 2022-01-11 ENCOUNTER — Other Ambulatory Visit: Payer: Self-pay

## 2022-04-08 ENCOUNTER — Other Ambulatory Visit: Payer: Self-pay

## 2022-04-13 ENCOUNTER — Other Ambulatory Visit: Payer: Self-pay

## 2022-04-14 ENCOUNTER — Other Ambulatory Visit: Payer: Self-pay

## 2022-04-18 ENCOUNTER — Other Ambulatory Visit: Payer: Self-pay

## 2022-07-06 ENCOUNTER — Other Ambulatory Visit: Payer: Self-pay

## 2022-07-06 ENCOUNTER — Other Ambulatory Visit: Payer: Self-pay | Admitting: Nurse Practitioner

## 2022-07-06 DIAGNOSIS — N529 Male erectile dysfunction, unspecified: Secondary | ICD-10-CM

## 2022-07-06 DIAGNOSIS — I10 Essential (primary) hypertension: Secondary | ICD-10-CM

## 2022-07-06 MED ORDER — AMLODIPINE BESYLATE 10 MG PO TABS
10.0000 mg | ORAL_TABLET | Freq: Every day | ORAL | 0 refills | Status: DC
  Filled 2022-07-06: qty 30, 30d supply, fill #0

## 2022-07-06 MED ORDER — LOSARTAN POTASSIUM 25 MG PO TABS
25.0000 mg | ORAL_TABLET | Freq: Every day | ORAL | 0 refills | Status: DC
  Filled 2022-07-06: qty 30, 30d supply, fill #0

## 2022-07-06 MED ORDER — SILDENAFIL CITRATE 100 MG PO TABS
100.0000 mg | ORAL_TABLET | Freq: Every day | ORAL | 0 refills | Status: DC | PRN
  Filled 2022-07-06: qty 10, 10d supply, fill #0

## 2022-07-11 ENCOUNTER — Other Ambulatory Visit: Payer: Self-pay | Admitting: Nurse Practitioner

## 2022-07-11 ENCOUNTER — Other Ambulatory Visit: Payer: Self-pay

## 2022-07-11 DIAGNOSIS — G8929 Other chronic pain: Secondary | ICD-10-CM

## 2022-07-11 DIAGNOSIS — M545 Low back pain, unspecified: Secondary | ICD-10-CM

## 2022-07-11 NOTE — Telephone Encounter (Signed)
Requested medication (s) are due for refill today: unclear  Requested medication (s) are on the active medication list: yes  Last refill:  04/13/22 #60 with 2 RF  Future visit scheduled: no, last seen 11/22/21  Notes to clinic:  This medication can not be delegated, please assess.        Requested Prescriptions  Pending Prescriptions Disp Refills   cyclobenzaprine (FLEXERIL) 10 MG tablet 60 tablet 2    Sig: Take 1 tablet (10 mg total) by mouth 3 (three) times daily as needed for muscle spasms.     Not Delegated - Analgesics:  Muscle Relaxants Failed - 07/11/2022  8:49 AM      Failed - This refill cannot be delegated      Failed - Valid encounter within last 6 months    Recent Outpatient Visits           7 months ago Encounter for annual physical exam   [place], Vernia Buff, NP   1 year ago Primary hypertension   [place], Vernia Buff, NP   1 year ago Essential hypertension   [place], Zelda W, NP   1 year ago Side pain   [place], Zelda W, NP   1 year ago Essential hypertension   [place], Vernia Buff, NP       Future Appointments             In 2 weeks Hollice Espy, MD [facility] [place]            Refused Prescriptions Disp Refills   meloxicam (MOBIC) 7.5 MG tablet 30 tablet 3    Sig: Take 1 tablet (7.5 mg total) by mouth daily.     Analgesics:  COX2 Inhibitors Failed - 07/11/2022  8:49 AM      Failed - Manual Review: Labs are only required if the patient has taken medication for more than 8 weeks.      Failed - HGB in normal range and within 360 days    Hemoglobin  Date Value Ref Range Status  11/22/2021 12.9 (L) 13.0 - 17.7 g/dL Final         Passed - Cr in normal range and within 360 days    Creatinine, Ser  Date Value Ref Range Status   11/22/2021 1.15 0.76 - 1.27 mg/dL Final         Passed - HCT in normal range and within 360 days    Hematocrit  Date Value Ref Range Status  11/22/2021 39.2 37.5 - 51.0 % Final         Passed - AST in normal range and within 360 days    AST  Date Value Ref Range Status  11/22/2021 16 0 - 40 IU/L Final         Passed - ALT in normal range and within 360 days    ALT  Date Value Ref Range Status  11/22/2021 13 0 - 44 IU/L Final         Passed - eGFR is 30 or above and within 360 days    GFR calc Af Amer  Date Value Ref Range Status  10/19/2020 88 >59 mL/min/1.73 Final    Comment:    **In accordance with recommendations from the NKF-ASN Task force,**   [facility] is in the process of updating its eGFR  calculation to the   2021 CKD-EPI creatinine equation that estimates kidney function   without a race variable.    GFR, Estimated  Date Value Ref Range Status  05/18/2021 >60 >60 mL/min Final    Comment:    (NOTE) Calculated using the CKD-EPI Creatinine Equation (2021)    eGFR  Date Value Ref Range Status  11/22/2021 76 >59 mL/min/1.73 Final         Passed - Patient is not pregnant      Passed - Valid encounter within last 12 months    Recent Outpatient Visits           7 months ago Encounter for annual physical exam   [place], Vernia Buff, NP   1 year ago Primary hypertension   [place], Vernia Buff, NP   1 year ago Essential hypertension   [place], Vernia Buff, NP   1 year ago Side pain   [place], Vernia Buff, NP   1 year ago Essential hypertension   [place], Vernia Buff, NP       Future Appointments             In 2 weeks Hollice Espy, MD [place]

## 2022-07-11 NOTE — Telephone Encounter (Signed)
Requested Prescriptions  Pending Prescriptions Disp Refills  . meloxicam (MOBIC) 7.5 MG tablet 30 tablet 3    Sig: Take 1 tablet (7.5 mg total) by mouth daily.     Analgesics:  COX2 Inhibitors Failed - 07/11/2022  8:49 AM      Failed - Manual Review: Labs are only required if the patient has taken medication for more than 8 weeks.      Failed - HGB in normal range and within 360 days    Hemoglobin  Date Value Ref Range Status  11/22/2021 12.9 (L) 13.0 - 17.7 g/dL Final         Passed - Cr in normal range and within 360 days    Creatinine, Ser  Date Value Ref Range Status  11/22/2021 1.15 0.76 - 1.27 mg/dL Final         Passed - HCT in normal range and within 360 days    Hematocrit  Date Value Ref Range Status  11/22/2021 39.2 37.5 - 51.0 % Final         Passed - AST in normal range and within 360 days    AST  Date Value Ref Range Status  11/22/2021 16 0 - 40 IU/L Final         Passed - ALT in normal range and within 360 days    ALT  Date Value Ref Range Status  11/22/2021 13 0 - 44 IU/L Final         Passed - eGFR is 30 or above and within 360 days    GFR calc Af Amer  Date Value Ref Range Status  10/19/2020 88 >59 mL/min/1.73 Final    Comment:    **In accordance with recommendations from the NKF-ASN Task force,**   [facility] is in the process of updating its eGFR calculation to the   2021 CKD-EPI creatinine equation that estimates kidney function   without a race variable.    GFR, Estimated  Date Value Ref Range Status  05/18/2021 >60 >60 mL/min Final    Comment:    (NOTE) Calculated using the CKD-EPI Creatinine Equation (2021)    eGFR  Date Value Ref Range Status  11/22/2021 76 >59 mL/min/1.73 Final         Passed - Patient is not pregnant      Passed - Valid encounter within last 12 months    Recent Outpatient Visits          7 months ago Encounter for annual physical exam   [place], Vernia Buff, NP   1 year  ago Primary hypertension   [place], Vernia Buff, NP   1 year ago Essential hypertension   [place], Vernia Buff, NP   1 year ago Side pain   [place], Vernia Buff, NP   1 year ago Essential hypertension   [place], Vernia Buff, NP      Future Appointments            In 2 weeks Hollice Espy, MD [facility] [place]           . cyclobenzaprine (FLEXERIL) 10 MG tablet 60 tablet 2    Sig: Take 1 tablet (10 mg total) by mouth 3 (three) times daily as needed for muscle spasms.     Not Delegated - Analgesics:  Muscle Relaxants Failed - 07/11/2022  8:49 AM      Failed - This refill cannot be delegated      Failed - Valid encounter within last 6 months    Recent Outpatient Visits          7 months ago Encounter for annual physical exam   [place], Vernia Buff, NP   1 year ago Primary hypertension   [place], Vernia Buff, NP   1 year ago Essential hypertension   [place], Zelda W, NP   1 year ago Side pain   [place], Vernia Buff, NP   1 year ago Essential hypertension   [place], Vernia Buff, NP      Future Appointments            In 2 weeks Hollice Espy, MD [place]

## 2022-07-13 ENCOUNTER — Other Ambulatory Visit: Payer: Self-pay | Admitting: Nurse Practitioner

## 2022-07-13 ENCOUNTER — Other Ambulatory Visit: Payer: Self-pay

## 2022-07-13 DIAGNOSIS — M545 Low back pain, unspecified: Secondary | ICD-10-CM

## 2022-07-13 DIAGNOSIS — H04123 Dry eye syndrome of bilateral lacrimal glands: Secondary | ICD-10-CM

## 2022-07-13 MED ORDER — MELOXICAM 7.5 MG PO TABS
7.5000 mg | ORAL_TABLET | Freq: Every day | ORAL | Status: DC
  Filled 2022-07-13: qty 30, 30d supply, fill #0

## 2022-07-13 MED ORDER — MELOXICAM 7.5 MG PO TABS
7.5000 mg | ORAL_TABLET | Freq: Every day | ORAL | 1 refills | Status: DC
  Filled 2022-07-13: qty 30, 30d supply, fill #0
  Filled 2022-08-07: qty 30, 30d supply, fill #1

## 2022-07-13 MED ORDER — CYCLOBENZAPRINE HCL 10 MG PO TABS
10.0000 mg | ORAL_TABLET | Freq: Three times a day (TID) | ORAL | 2 refills | Status: DC | PRN
  Filled 2022-07-13: qty 60, 20d supply, fill #0
  Filled 2022-08-07: qty 60, 20d supply, fill #1
  Filled 2022-11-19 – 2022-11-21 (×2): qty 60, 20d supply, fill #2

## 2022-07-13 MED ORDER — HYPROMELLOSE (GONIOSCOPIC) 2.5 % OP SOLN
1.0000 [drp] | Freq: Every day | OPHTHALMIC | 0 refills | Status: DC
  Filled 2022-07-13 – 2022-08-07 (×2): qty 15, 300d supply, fill #0

## 2022-07-13 NOTE — Telephone Encounter (Signed)
Requested Prescriptions  Pending Prescriptions Disp Refills  . hydroxypropyl methylcellulose / hypromellose (ISOPTO TEARS / GONIOVISC) 2.5 % ophthalmic solution 15 mL 0    Sig: Place 1 drop into both eyes daily.     Ophthalmology:  Genoveva Ill - 07/13/2022 11:26 AM      Passed - Valid encounter within last 12 months    Recent Outpatient Visits          7 months ago Encounter for annual physical exam   [place], Vernia Buff, NP   1 year ago Primary hypertension   [place], Vernia Buff, NP   1 year ago Essential hypertension   [place], Vernia Buff, NP   1 year ago Side pain   [place], Vernia Buff, NP   2 years ago Essential hypertension   [place], Vernia Buff, NP      Future Appointments            In 2 weeks Hollice Espy, MD [place]

## 2022-07-13 NOTE — Telephone Encounter (Signed)
Medication Refill - Medication: hydroxypropyl methylcellulose / hypromellose (ISOPTO TEARS / GONIOVISC) 2.5 % ophthalmic solution  Has the patient contacted their pharmacy? No  Preferred Pharmacy (with phone number or street name): [facility]  Has the patient been seen for an appointment in the last year OR does the patient have an upcoming appointment? Yes.    Agent: Please be advised that RX refills may take up to 3 business days. We ask that you follow-up with your pharmacy.

## 2022-07-13 NOTE — Telephone Encounter (Signed)
Meloxicam is no longer on patient's medication list. He has not been seen since Feb of this year. Will forward request to PCP to approve if appropriate. He likely needs an appointment.

## 2022-07-13 NOTE — Telephone Encounter (Signed)
Pt following up on refill request.   No appts w/ Zelda until ec.  Pt declined to make Dec appt, stating he needs something sooner due to his ongoing back pain. Pt declined to see another provider. Please advise.

## 2022-07-14 ENCOUNTER — Other Ambulatory Visit: Payer: Self-pay

## 2022-07-19 ENCOUNTER — Other Ambulatory Visit: Payer: Self-pay

## 2022-07-27 ENCOUNTER — Other Ambulatory Visit: Payer: Self-pay | Admitting: Urology

## 2022-07-27 ENCOUNTER — Ambulatory Visit: Payer: Self-pay | Admitting: Urology

## 2022-07-27 DIAGNOSIS — N289 Disorder of kidney and ureter, unspecified: Secondary | ICD-10-CM

## 2022-08-02 ENCOUNTER — Ambulatory Visit (HOSPITAL_COMMUNITY)
Admission: RE | Admit: 2022-08-02 | Discharge: 2022-08-02 | Disposition: A | Discharge status: Home / Self Care | Payer: Self-pay | Source: Ambulatory Visit | Attending: Urology | Admitting: Urology

## 2022-08-02 DIAGNOSIS — N289 Disorder of kidney and ureter, unspecified: Secondary | ICD-10-CM | POA: Insufficient documentation

## 2022-08-06 ENCOUNTER — Ambulatory Visit (HOSPITAL_COMMUNITY): Payer: Self-pay

## 2022-08-07 ENCOUNTER — Other Ambulatory Visit: Payer: Self-pay | Admitting: Family Medicine

## 2022-08-07 DIAGNOSIS — N529 Male erectile dysfunction, unspecified: Secondary | ICD-10-CM

## 2022-08-07 DIAGNOSIS — I10 Essential (primary) hypertension: Secondary | ICD-10-CM

## 2022-08-08 ENCOUNTER — Other Ambulatory Visit: Payer: Self-pay

## 2022-08-08 NOTE — Telephone Encounter (Signed)
Requested medication (s) are due for refill today: yes  Requested medication (s) are on the active medication list: yes  Last refill:  Norvasc 07/06/22 #30 with 0 RF, Cozaar 07/06/22 #30 with  RF, Viagara 07/06/22 #10 with 0 RF  Future visit scheduled: no, seen 11/22/21  Notes to clinic:  Has already had a curtesy refill and there is no upcoming appointment scheduled.       Requested Prescriptions  Pending Prescriptions Disp Refills   sildenafil (VIAGRA) 100 MG tablet 10 tablet 0    Sig: Take 1 tablet (100 mg total) by mouth daily as needed for erectile dysfunction.     Urology: Erectile Dysfunction Agents Failed - 08/07/2022  9:36 PM      Failed - Last BP in normal range    BP Readings from Last 1 Encounters:  11/22/21 (!) 148/88         Passed - AST in normal range and within 360 days    AST  Date Value Ref Range Status  11/22/2021 16 0 - 40 IU/L Final         Passed - ALT in normal range and within 360 days    ALT  Date Value Ref Range Status  11/22/2021 13 0 - 44 IU/L Final         Passed - Valid encounter within last 12 months    Recent Outpatient Visits           8 months ago Encounter for annual physical exam   [place], Vernia Buff, NP   1 year ago Primary hypertension   [place], Vernia Buff, NP   1 year ago Essential hypertension   [place], Vernia Buff, NP   1 year ago Side pain   [place], Vernia Buff, NP   2 years ago Essential hypertension   [place], Vernia Buff, NP       Future Appointments             In 2 weeks Hollice Espy, MD [facility] [place]             amLODipine (NORVASC) 10 MG tablet 30 tablet 0    Sig: Take 1 tablet (10 mg total) by mouth daily.     Cardiovascular: Calcium Channel Blockers 2 Failed - 08/07/2022  9:36 PM       Failed - Last BP in normal range    BP Readings from Last 1 Encounters:  11/22/21 (!) 148/88         Failed - Valid encounter within last 6 months    Recent Outpatient Visits           8 months ago Encounter for annual physical exam   [place], Vernia Buff, NP   1 year ago Primary hypertension   [place], Vernia Buff, NP   1 year ago Essential hypertension   [place], Vernia Buff, NP   1 year ago Side pain   [place], Vernia Buff, NP   2 years ago Essential hypertension   [place], Zelda W, NP       Future Appointments             In  2 weeks Hollice Espy, MD [facility] [place]            Passed - Last Heart Rate in normal range    Pulse Readings from Last 1 Encounters:  11/22/21 68          losartan (COZAAR) 25 MG tablet 30 tablet 0    Sig: Take 1 tablet (25 mg total) by mouth daily.     Cardiovascular:  Angiotensin Receptor Blockers Failed - 08/07/2022  9:36 PM      Failed - Cr in normal range and within 180 days    Creatinine, Ser  Date Value Ref Range Status  11/22/2021 1.15 0.76 - 1.27 mg/dL Final         Failed - K in normal range and within 180 days    Potassium  Date Value Ref Range Status  11/22/2021 4.3 3.5 - 5.2 mmol/L Final         Failed - Last BP in normal range    BP Readings from Last 1 Encounters:  11/22/21 (!) 148/88         Failed - Valid encounter within last 6 months    Recent Outpatient Visits           8 months ago Encounter for annual physical exam   [place], Vernia Buff, NP   1 year ago Primary hypertension   [place], Vernia Buff, NP   1 year ago Essential hypertension   [place], Vernia Buff, NP   1 year ago Side  pain   [place], Vernia Buff, NP   2 years ago Essential hypertension   [place], Vernia Buff, NP       Future Appointments             In 2 weeks Hollice Espy, MD [facility] - Patient is not pregnant

## 2022-08-09 ENCOUNTER — Other Ambulatory Visit: Payer: Self-pay

## 2022-08-10 ENCOUNTER — Other Ambulatory Visit: Payer: Self-pay | Admitting: Pharmacist

## 2022-08-10 ENCOUNTER — Other Ambulatory Visit: Payer: Self-pay

## 2022-08-10 DIAGNOSIS — I10 Essential (primary) hypertension: Secondary | ICD-10-CM

## 2022-08-10 MED ORDER — LOSARTAN POTASSIUM 25 MG PO TABS
25.0000 mg | ORAL_TABLET | Freq: Every day | ORAL | 1 refills | Status: DC
  Filled 2022-08-10: qty 30, 30d supply, fill #0

## 2022-08-22 ENCOUNTER — Other Ambulatory Visit: Payer: Self-pay

## 2022-08-22 ENCOUNTER — Encounter: Payer: Self-pay | Admitting: Nurse Practitioner

## 2022-08-22 ENCOUNTER — Ambulatory Visit (INDEPENDENT_AMBULATORY_CARE_PROVIDER_SITE_OTHER): Payer: Self-pay | Admitting: Nurse Practitioner

## 2022-08-22 VITALS — BP 153/99 | HR 59 | Temp 97.8°F | Ht 73.0 in | Wt 197.2 lb

## 2022-08-22 DIAGNOSIS — H04123 Dry eye syndrome of bilateral lacrimal glands: Secondary | ICD-10-CM

## 2022-08-22 DIAGNOSIS — I10 Essential (primary) hypertension: Secondary | ICD-10-CM

## 2022-08-22 DIAGNOSIS — N529 Male erectile dysfunction, unspecified: Secondary | ICD-10-CM | POA: Insufficient documentation

## 2022-08-22 MED ORDER — VISTA GONIO DRY EYE RELIEF 2.5 % OP SOLN
1.0000 [drp] | Freq: Every day | OPHTHALMIC | 0 refills | Status: AC
  Filled 2022-08-22 – 2022-09-05 (×2): qty 15, 300d supply, fill #0
  Filled 2022-11-19: qty 15, 150d supply, fill #0

## 2022-08-22 MED ORDER — CLONIDINE HCL 0.1 MG PO TABS
0.1000 mg | ORAL_TABLET | Freq: Once | ORAL | Status: AC
  Administered 2022-08-22: 0.1 mg via ORAL

## 2022-08-22 MED ORDER — SILDENAFIL CITRATE 100 MG PO TABS
100.0000 mg | ORAL_TABLET | Freq: Every day | ORAL | 0 refills | Status: DC | PRN
  Filled 2022-08-22: qty 10, 10d supply, fill #0

## 2022-08-22 MED ORDER — AMLODIPINE BESYLATE 10 MG PO TABS
10.0000 mg | ORAL_TABLET | Freq: Every day | ORAL | 0 refills | Status: DC
  Filled 2022-08-22: qty 90, 90d supply, fill #0

## 2022-08-22 MED ORDER — LOSARTAN POTASSIUM 25 MG PO TABS
25.0000 mg | ORAL_TABLET | Freq: Every day | ORAL | 0 refills | Status: DC
  Filled 2022-08-22 – 2022-09-05 (×2): qty 90, 90d supply, fill #0

## 2022-08-22 NOTE — Assessment & Plan Note (Signed)
Sildenafil 100 mg daily as needed reordered.

## 2022-08-22 NOTE — Progress Notes (Signed)
Established Patient Office Visit  Subjective:  Patient ID: Erik Choi, male    DOB: Aug 25, 1966  Age: 56 y.o. MRN: [id number]  CC:  Chief Complaint  Patient presents with   Medication Refill    HPI Erik Choi is a 56 y.o. male with past medical history of hypertension, dry eyes, erectile dysfunction presents for follow-up for his chronic medical condition. Patient of Raul Del NP.    Hypertension. He ran out of amlodipine about a month ago. Taking losartan 36m daily , patient denies HA, dizziness, CP, edema.   Patient denies adverse reactions to current medications.   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   Erectile dysfunction    Hypertension 2019    Past Surgical History:  Procedure Laterality Date   HAND SURGERY Left    56yo    Family History  Problem Relation Age of Onset   Cancer Neg Hx    Diabetes Neg Hx     Social History   Socioeconomic History   Marital status: Single    Spouse name: Not on file   Number of children: Not on file   Years of education: Not on file   Highest education level: Not on file  Occupational History   Not on file  Tobacco Use   Smoking status: Never   Smokeless tobacco: Never  Vaping Use   Vaping Use: Never used  Substance and Sexual Activity   Alcohol use: Never   Drug use: Never   Sexual activity: Yes  Other Topics Concern   Not on file  Social History Narrative   Not on file   Social Determinants of Health   Financial Resource Strain: Not on file  Food Insecurity: Not on file  Transportation Needs: Not on file  Physical Activity: Not on file  Stress: Not on file  Social Connections: Not on file  Intimate Partner Violence: Not on file    Outpatient Medications Prior to Visit  Medication Sig Dispense Refill   cyclobenzaprine (FLEXERIL) 10 MG tablet Take 1 tablet (10 mg total) by mouth 3 (three) times daily as needed for muscle spasms. 60 tablet 2   meloxicam (MOBIC) 7.5 MG  tablet Take 1 tablet (7.5 mg total) by mouth daily. 30 tablet 1   Multiple Vitamin (MULTIVITAMIN WITH MINERALS) TABS tablet Take 1 tablet by mouth daily. 30 tablet 6   hydroxypropyl methylcellulose / hypromellose (ISOPTO TEARS / GONIOVISC) 2.5 % ophthalmic solution Place 1 drop into both eyes daily. 15 mL 0   losartan (COZAAR) 25 MG tablet Take 1 tablet (25 mg total) by mouth daily. (Must have office visit for refills) 30 tablet 1   amLODipine (NORVASC) 10 MG tablet Take 1 tablet (10 mg total) by mouth daily. (Patient not taking: Reported on 08/22/2022) 30 tablet 0   sildenafil (VIAGRA) 100 MG tablet Take 1 tablet (100 mg total) by mouth daily as needed for erectile dysfunction. 10 tablet 0   No facility-administered medications prior to visit.    Allergies  Allergen Reactions   Lisinopril    Hctz [Hydrochlorothiazide] Other (See Comments)    Headache     ROS Review of Systems  Constitutional: Negative.   Eyes:  Negative for pain, discharge and itching.       Dry eyes bilaterally  Respiratory: Negative.  Negative for cough, shortness of breath, wheezing and stridor.   Cardiovascular: Negative.  Negative for chest pain and leg swelling.  Gastrointestinal: Negative.  Negative for abdominal distention, abdominal pain and anal bleeding.  Genitourinary: Negative.  Negative for difficulty urinating, dysuria and enuresis.  Neurological:  Negative for dizziness, facial asymmetry, light-headedness, numbness and headaches.  Psychiatric/Behavioral: Negative.  Negative for hallucinations and self-injury. The patient is not nervous/anxious and is not hyperactive.       Objective:    Physical Exam Constitutional:      General: He is not in acute distress.    Appearance: Normal appearance. He is not ill-appearing, toxic-appearing or diaphoretic.  Eyes:     General: No scleral icterus.       Right eye: No discharge.        Left eye: No discharge.     Extraocular Movements: Extraocular  movements intact.     Conjunctiva/sclera: Conjunctivae normal.     Pupils: Pupils are equal, round, and reactive to light.  Cardiovascular:     Rate and Rhythm: Normal rate and regular rhythm.     Pulses: Normal pulses.     Heart sounds: Normal heart sounds. No murmur heard.    No friction rub.  Pulmonary:     Effort: No respiratory distress.     Breath sounds: No stridor. No wheezing, rhonchi or rales.  Chest:     Chest wall: No tenderness.  Abdominal:     General: There is no distension.     Palpations: There is no mass.     Tenderness: There is no abdominal tenderness. There is no guarding.  Musculoskeletal:        General: No swelling, tenderness, deformity or signs of injury.     Right lower leg: No edema.     Left lower leg: No edema.  Skin:    General: Skin is warm and dry.     Capillary Refill: Capillary refill takes less than 2 seconds.  Neurological:     Mental Status: He is oriented to person, place, and time.     Cranial Nerves: No cranial nerve deficit.     Sensory: No sensory deficit.     Motor: No weakness.     Gait: Gait normal.  Psychiatric:        Mood and Affect: Mood normal.        Behavior: Behavior normal.        Thought Content: Thought content normal.        Judgment: Judgment normal.     BP (!) 153/99   Pulse (!) 59   Temp 97.8 F (36.6 C)   Ht _0  (1.854 m)   Wt 197 lb 3.2 oz (89.4 kg)   SpO2 100%   BMI 26.02 kg/m  Wt Readings from Last 3 Encounters:  08/22/22 197 lb 3.2 oz (89.4 kg)  11/22/21 202 lb 2 oz (91.7 kg)  07/27/21 200 lb (90.7 kg)    Lab Results  Component Value Date   TSH 1.980 07/13/2020   Lab Results  Component Value Date   WBC 4.2 11/22/2021   HGB 12.9 (L) 11/22/2021   HCT 39.2 11/22/2021   MCV 86 11/22/2021   PLT 278 11/22/2021   Lab Results  Component Value Date   NA 142 11/22/2021   K 4.3 11/22/2021   CO2 22 11/22/2021   GLUCOSE 80 11/22/2021   BUN 12 11/22/2021   CREATININE 1.15 11/22/2021    BILITOT 0.2 11/22/2021   ALKPHOS 124 (H) 11/22/2021   AST 16 11/22/2021   ALT 13 11/22/2021   PROT 7.3 11/22/2021   ALBUMIN 4.7 11/22/2021  CALCIUM 9.7 11/22/2021   ANIONGAP 6 05/18/2021   EGFR 76 11/22/2021   Lab Results  Component Value Date   CHOL 168 11/22/2021   Lab Results  Component Value Date   HDL 37 (L) 11/22/2021   Lab Results  Component Value Date   LDLCALC 103 (H) 11/22/2021   Lab Results  Component Value Date   TRIG 159 (H) 11/22/2021   Lab Results  Component Value Date   CHOLHDL 4.5 11/22/2021   Lab Results  Component Value Date   HGBA1C 5.3 05/24/2018      Assessment & Plan:   Problem List Items Addressed This Visit       Cardiovascular and Mediastinum   HTN (hypertension) - Primary    BP Readings from Last 3 Encounters:  08/22/22 (!) 169/111  11/22/21 (!) 148/88  07/27/21 (!) 157/90  Blood pressure is elevated in the office today he has been out of amlodipine 10 mg daily for about a month, currently taking losartan 25 mg daily.  Amlodipine 10 mg daily refilled Need to take medications daily as ordered discussed Clonidine 0.1 mg one-time dose given in the office today DASH diet Patient engaged in regular moderate exercises at least 150 minutes weekly We will follow-up in the office in 2 weeks milligrams to recheck BP CMP today       Relevant Medications   losartan (COZAAR) 25 MG tablet   amLODipine (NORVASC) 10 MG tablet   sildenafil (VIAGRA) 100 MG tablet   Other Relevant Orders   CMP14+EGFR     Other   Dry eyes    Isopto Tears/ Goniovisc 2.5% ophthalmic solution 1 drop daily refilled       Relevant Medications   hydroxypropyl methylcellulose / hypromellose (ISOPTO TEARS / GONIOVISC) 2.5 % ophthalmic solution   Erectile dysfunction    Sildenafil 100 mg daily as needed reordered.      Relevant Medications   sildenafil (VIAGRA) 100 MG tablet    Meds ordered this encounter  Medications   losartan (COZAAR) 25 MG tablet     Sig: Take 1 tablet (25 mg total) by mouth daily. (Must have office visit for refills)    Dispense:  90 tablet    Refill:  0   amLODipine (NORVASC) 10 MG tablet    Sig: Take 1 tablet (10 mg total) by mouth daily.    Dispense:  90 tablet    Refill:  0    Must have office visit for refills   hydroxypropyl methylcellulose / hypromellose (ISOPTO TEARS / GONIOVISC) 2.5 % ophthalmic solution    Sig: Place 1 drop into both eyes daily.    Dispense:  15 mL    Refill:  0   sildenafil (VIAGRA) 100 MG tablet    Sig: Take 1 tablet (100 mg total) by mouth daily as needed for erectile dysfunction.    Dispense:  10 tablet    Refill:  0    Must have office visit for refills   cloNIDine (CATAPRES) tablet 0.1 mg    Follow-up: No follow-ups on file.    Renee Rival, FNP

## 2022-08-22 NOTE — Assessment & Plan Note (Signed)
Isopto Tears/ Goniovisc 2.5% ophthalmic solution 1 drop daily refilled

## 2022-08-22 NOTE — Assessment & Plan Note (Addendum)
BP Readings from Last 3 Encounters:  08/22/22 (!) 169/111  11/22/21 (!) 148/88  07/27/21 (!) 157/90  Blood pressure is elevated in the office today he has been out of amlodipine 10 mg daily for about a month, currently taking losartan 25 mg daily.  Amlodipine 10 mg daily refilled Need to take medications daily as ordered discussed Clonidine 0.1 mg one-time dose given in the office today, BP 153/99 after clonidine given  DASH diet Patient engaged in regular moderate exercises at least 150 minutes weekly We will follow-up in the office in 2 weeks milligrams to recheck BP CMP today

## 2022-08-22 NOTE — Patient Instructions (Addendum)
Nurse visit in 2 weeks to recheck BP Nurse please give clonidine 0.12m one time dose today.  1. Primary hypertension  CMP14+EGFR - losartan (COZAAR) 25 MG tablet; Take 1 tablet (25 mg total) by mouth daily. (Must have office visit for refills)  Dispense: 90 tablet; Refill: 0 - amLODipine (NORVASC) 10 MG tablet; Take 1 tablet (10 mg total) by mouth daily.  Dispense: 90 tablet; Refill: 0  2. Dry eyes  - hydroxypropyl methylcellulose / hypromellose (ISOPTO TEARS / GONIOVISC) 2.5 % ophthalmic solution; Place 1 drop into both eyes daily.  Dispense: 15 mL; Refill: 0  3. Erectile dysfunction, unspecified erectile dysfunction type  - sildenafil (VIAGRA) 100 MG tablet; Take 1 tablet (100 mg total) by mouth daily as needed for erectile dysfunction.  Dispense: 10 tablet; Refill: 0    Around 3 times per week, check your blood pressure 2 times per day. once in the morning and once in the evening. The readings should be at least one minute apart. Write down these values and bring them to your next nurse visit/appointment.  When you check your BP, make sure you have been doing something calm/relaxing 5 minutes prior to checking. Both feet should be flat on the floor and you should be sitting. Use your left arm and make sure it is in a relaxed position (on a table), and that the cuff is at the approximate level/height of your heart.   It is important that you exercise regularly at least 30 minutes 5 times a week as tolerated  Think about what you will eat, plan ahead. Choose " clean, green, fresh or frozen" over canned, processed or packaged foods which are more sugary, salty and fatty. 70 to 75% of food eaten should be vegetables and fruit. Three meals at set times with snacks allowed between meals, but they must be fruit or vegetables. Aim to eat over a 12 hour period , example 7 am to 7 pm, and STOP after  your last meal of the day. Drink water,generally about 64 ounces per day, no other drink is as  healthy. Fruit juice is best enjoyed in a healthy way, by EATING the fruit.  Thanks for choosing [facility] consider it a privelige to serve you.

## 2022-08-23 LAB — CMP14+EGFR
ALT: 15 IU/L (ref 0–44)
AST: 17 IU/L (ref 0–40)
Albumin/Globulin Ratio: 1.6 (ref 1.2–2.2)
Albumin: 4.7 g/dL (ref 3.8–4.9)
Alkaline Phosphatase: 130 IU/L — ABNORMAL HIGH (ref 44–121)
BUN/Creatinine Ratio: 16 (ref 9–20)
BUN: 20 mg/dL (ref 6–24)
Bilirubin Total: 0.2 mg/dL (ref 0.0–1.2)
CO2: 26 mmol/L (ref 20–29)
Calcium: 9.7 mg/dL (ref 8.7–10.2)
Chloride: 101 mmol/L (ref 96–106)
Creatinine, Ser: 1.29 mg/dL — ABNORMAL HIGH (ref 0.76–1.27)
Globulin, Total: 2.9 g/dL (ref 1.5–4.5)
Glucose: 93 mg/dL (ref 70–99)
Potassium: 4.4 mmol/L (ref 3.5–5.2)
Sodium: 142 mmol/L (ref 134–144)
Total Protein: 7.6 g/dL (ref 6.0–8.5)
eGFR: 65 mL/min/{1.73_m2} (ref >59)

## 2022-08-23 NOTE — Progress Notes (Signed)
Liver enzymes still remains elevated , avoid alcohol, excessive use of tylenol, creatinine level is slightly elevated but EGFR is within normal limits, continue current medications and follow up to recheck BP in 2 weeks as planned , discuss elevated liver enzymes with his PCP at next follow up, might need further work at that time.

## 2022-08-24 ENCOUNTER — Other Ambulatory Visit: Payer: Self-pay

## 2022-08-24 ENCOUNTER — Ambulatory Visit (INDEPENDENT_AMBULATORY_CARE_PROVIDER_SITE_OTHER): Payer: Self-pay | Admitting: Urology

## 2022-08-24 VITALS — BP 144/80 | HR 65 | Ht 73.0 in | Wt 198.0 lb

## 2022-08-24 DIAGNOSIS — N289 Disorder of kidney and ureter, unspecified: Secondary | ICD-10-CM

## 2022-08-24 DIAGNOSIS — N5203 Combined arterial insufficiency and corporo-venous occlusive erectile dysfunction: Secondary | ICD-10-CM

## 2022-08-24 MED ORDER — SILDENAFIL CITRATE 20 MG PO TABS
20.0000 mg | ORAL_TABLET | ORAL | 11 refills | Status: DC | PRN
  Filled 2022-08-24: qty 10, 30d supply, fill #0

## 2022-08-24 NOTE — Progress Notes (Unsigned)
08/24/2022 3:34 PM   Erik Choi Erik Choi 1966-01-28 [id number]  Referring provider: [name], NP [place],  [place] [address]  Chief Complaint  Patient presents with   Follow-up    HPI: 56 year old male with a personal history of right renal mass and rectal dysfunction who returns today for annual follow-up.  He has a personal history of right renal mass on right mid anterior measuring 2.6 cm in 2021. 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  This lesion was first diagnosed in 2019 and had remained stable in this 2-year interval.  Originally, he had scheduled surgery but ended up canceling.  He had a CT scan last year indicating minimal interval change, stable since 2019 and elected surveillance.  He returns today with follow-up renal ultrasound.  Lesion overall appears stable, 2.5 cm in right interpole.  He is asymptomatic, denies flank pain or gross hematuria.  He has been having headaches after using Viagra daily.  He has been using 50 mg with good response.      PMH: Past Medical History:  Diagnosis Date   Cancer ([place]) 2019   renal   Erectile dysfunction    Hypertension 2019    Surgical History: Past Surgical History:  Procedure Laterality Date   HAND SURGERY Left    56 yo    Home Medications:  Allergies as of 08/24/2022       Reactions   Lisinopril    Hctz [hydrochlorothiazide] Other (See Comments)   Headache        Medication List        Accurate as of August 24, 2022 11:59 PM. If you have any questions, ask your nurse or doctor.          amLODipine 10 MG tablet Commonly known as: NORVASC Take 1 tablet (10 mg total) by mouth daily.   cyclobenzaprine 10 MG tablet Commonly known as: FLEXERIL Take 1 tablet (10 mg total) by mouth 3 (three) times daily as needed for muscle spasms.   hydroxypropyl methylcellulose / hypromellose 2.5 % ophthalmic solution Commonly known as:  ISOPTO TEARS / GONIOVISC Place 1 drop into both eyes daily.   losartan 25 MG tablet Commonly known as: COZAAR Take 1 tablet (25 mg total) by mouth daily. (Must have office visit for refills)   meloxicam 7.5 MG tablet Commonly known as: MOBIC Take 1 tablet (7.5 mg total) by mouth daily.   multivitamin with minerals Tabs tablet Take 1 tablet by mouth daily.   sildenafil 100 MG tablet Commonly known as: VIAGRA Take 1 tablet (100 mg total) by mouth daily as needed for erectile dysfunction.   sildenafil 20 MG tablet Commonly known as: Revatio Take 1-5 tablets (20-100 mg total) by mouth as needed prior to intercourse. Started by: Hollice Espy, MD        Allergies:  Allergies  Allergen Reactions   Lisinopril    Hctz [Hydrochlorothiazide] Other (See Comments)    Headache     Family History: Family History  Problem Relation Age of Onset   Cancer Neg Hx    Diabetes Neg Hx     Social History:  reports that he has never smoked. He has never used smokeless tobacco. He reports that he does not drink alcohol and does not use drugs.   Physical Exam: BP (!) 144/80   Pulse 65   Ht '6\' 1"'$  (1.854 m)   Wt 198 lb (89.8 kg)   BMI 26.12 kg/m  Constitutional:  Alert and oriented, No acute distress. HEENT: [place] AT, moist mucus membranes.  Trachea midline, no masses. Cardiovascular: No clubbing, cyanosis, or edema. Skin: No rashes, bruises or suspicious lesions. Neurologic: Grossly intact, no focal deficits, moving all 4 extremities. Psychiatric: Normal mood and affect.  Laboratory Data: Lab Results  Component Value Date   WBC 4.2 11/22/2021   HGB 12.9 (L) 11/22/2021   HCT 39.2 11/22/2021   MCV 86 11/22/2021   PLT 278 11/22/2021    Lab Results  Component Value Date   CREATININE 1.29 (H) 08/22/2022     Lab Results  Component Value Date   HGBA1C 5.3 05/24/2018    Urinalysis    Component Value Date/Time   COLORURINE COLORLESS (A) 05/02/2018 1137   APPEARANCEUR  Clear 06/29/2021 1453   LABSPEC 1.002 (L) 05/02/2018 1137   PHURINE 7.0 05/02/2018 1137   GLUCOSEU Negative 06/29/2021 1453   HGBUR NEGATIVE 05/02/2018 1137   BILIRUBINUR Negative 06/29/2021 1453   KETONESUR negative 10/19/2020 0848   KETONESUR NEGATIVE 05/02/2018 1137   PROTEINUR Negative 06/29/2021 1453   PROTEINUR NEGATIVE 05/02/2018 1137   UROBILINOGEN 0.2 10/19/2020 0848   NITRITE Negative 06/29/2021 1453   NITRITE NEGATIVE 05/02/2018 1137   LEUKOCYTESUR Negative 06/29/2021 1453    Lab Results  Component Value Date   LABMICR See below: 06/29/2021   WBCUA 0-5 06/29/2021   LABEPIT 0-10 06/29/2021   BACTERIA None seen 06/29/2021    Pertinent Imaging:  Ultrasound renal complete  Narrative CLINICAL DATA:  Hematuria.  Follow-up renal lesion.  EXAM: RENAL / URINARY TRACT ULTRASOUND COMPLETE  COMPARISON:  CT's of the abdomen and pelvis May 02, 2018 and July 27, 2021. MRI of the abdomen and pelvis August 18, 2020.  FINDINGS: Right Kidney:  Renal measurements: 12.2 x 5.8 x 6.6 cm = volume: 245 mL. The known interpolar solid mass associated with the right kidney measures 2.8 x 2.3 x 2.7 cm today versus 2.4 by 2.3 by 2.3 cm by my measurement on the July 27, 2021 CT scan. A small 6 mm cortical cyst is identified as well. No follow-up imaging recommended for the cyst.  Left Kidney:  Renal measurements: 10.3 x 6.5 x 6.3 cm = volume: 221 mL. Echogenicity within normal limits. No mass or hydronephrosis visualized.  Bladder:  Appears normal for degree of bladder distention.  Other:  The prostate is enlarged with a volume of 36.3 cc.  IMPRESSION: 1. The known solid mass associated with the right kidney measures 2.8 x 2.3 x 2.7 cm today versus 2.4 x 2.3 by 2.3 cm on the July 27, 2021 CT scan. It is unclear whether there has been a small amount of interval growth or if the increased size measurements are technical in nature given differences in modality.  The mass remains concerning for a renal cell carcinoma. 2. The left kidney is unremarkable. 3. The prostate is enlarged with a volume of 36.3 cc.   Electronically Signed By: Dorise Bullion III M.D. On: 08/03/2022 17:51  Above renal ultrasound was personally reviewed, overall stable in comparison to previous studies.  Agree with radiologic interpretation.   Assessment & Plan:    1. Kidney lesion Stable right-sided renal lesion, essentially unchanged or minimally changed since 2019  He continues to be most interested in surveillance which is quite reasonable  We will plan for renal ultrasound again in a year - US RENAL; Future  2. Combined arterial insufficiency and corporo-venous occlusive erectile dysfunction Headaches with higher dose sildenafil,  may benefit from lower dose version.  20 mg was prescribed and will titrate up as needed.  If this is not effective or continues to have headaches, can try to change to Cialis to see if less side effects with this particular medication.  He will let us know.   Return in about 1 year (around 08/25/2023) for RUS.  Hollice Espy, MD  [facility] [address] [place], [place] [address] [phone]

## 2022-08-31 ENCOUNTER — Other Ambulatory Visit: Payer: Self-pay

## 2022-09-05 ENCOUNTER — Other Ambulatory Visit: Payer: Self-pay

## 2022-09-05 ENCOUNTER — Ambulatory Visit (INDEPENDENT_AMBULATORY_CARE_PROVIDER_SITE_OTHER): Payer: Self-pay | Admitting: Nurse Practitioner

## 2022-09-05 ENCOUNTER — Encounter: Payer: Self-pay | Admitting: Nurse Practitioner

## 2022-09-05 VITALS — BP 129/87 | HR 61 | Temp 97.8°F | Wt 198.0 lb

## 2022-09-05 DIAGNOSIS — I10 Essential (primary) hypertension: Secondary | ICD-10-CM

## 2022-09-05 DIAGNOSIS — R748 Abnormal levels of other serum enzymes: Secondary | ICD-10-CM | POA: Insufficient documentation

## 2022-09-05 MED ORDER — LOSARTAN POTASSIUM 25 MG PO TABS
25.0000 mg | ORAL_TABLET | Freq: Every day | ORAL | 0 refills | Status: DC
  Filled 2022-11-19: qty 90, 90d supply, fill #0

## 2022-09-05 NOTE — Progress Notes (Signed)
Established Patient Office Visit  Subjective:  Patient ID: Erik Choi, male    DOB: Dec 30, 1965  Age: 56 y.o. MRN: [id number]  CC:  Chief Complaint  Patient presents with   Follow-up    Per pt he is taking medications and has log of b/p readings    HPI Erik Choi is a 56 y.o. male with past medical history of hypertension, erectile dysfunction, dry eyes presents to follow-up for hypertension.  Hypertension.  Currently on amlodipine 10 mg daily, losartan 25 mg daily, patient notes compliance with both medications.  He denies chest pain, dizziness, syncope.  Pressure has been well-controlled at home.    Past Medical History:  Diagnosis Date   Cancer ([place]) 2019   renal   Erectile dysfunction    Hypertension 2019    Past Surgical History:  Procedure Laterality Date   HAND SURGERY Left    56 yo    Family History  Problem Relation Age of Onset   Cancer Neg Hx    Diabetes Neg Hx     Social History   Socioeconomic History   Marital status: Single    Spouse name: Not on file   Number of children: Not on file   Years of education: Not on file   Highest education level: Not on file  Occupational History   Not on file  Tobacco Use   Smoking status: Never   Smokeless tobacco: Never  Vaping Use   Vaping Use: Never used  Substance and Sexual Activity   Alcohol use: Never   Drug use: Never   Sexual activity: Yes  Other Topics Concern   Not on file  Social History Narrative   Not on file   Social Determinants of Health   Financial Resource Strain: Not on file  Food Insecurity: Not on file  Transportation Needs: Not on file  Physical Activity: Not on file  Stress: Not on file  Social Connections: Not on file  Intimate Partner Violence: Not on file    Outpatient Medications Prior to Visit  Medication Sig Dispense Refill   amLODipine (NORVASC) 10 MG tablet Take 1 tablet (10 mg total) by mouth daily. 90 tablet 0   cyclobenzaprine (FLEXERIL) 10 MG tablet  Take 1 tablet (10 mg total) by mouth 3 (three) times daily as needed for muscle spasms. 60 tablet 2   Multiple Vitamin (MULTIVITAMIN WITH MINERALS) TABS tablet Take 1 tablet by mouth daily. 30 tablet 6   losartan (COZAAR) 25 MG tablet Take 1 tablet (25 mg total) by mouth daily. (Must have office visit for refills) 90 tablet 0   hydroxypropyl methylcellulose / hypromellose (ISOPTO TEARS / GONIOVISC) 2.5 % ophthalmic solution Place 1 drop into both eyes daily. (Patient not taking: Reported on 09/05/2022) 15 mL 0   meloxicam (MOBIC) 7.5 MG tablet Take 1 tablet (7.5 mg total) by mouth daily. (Patient not taking: Reported on 09/05/2022) 30 tablet 1   sildenafil (REVATIO) 20 MG tablet Take 1-5 tablets (20-100 mg total) by mouth as needed prior to intercourse. (Patient not taking: Reported on 09/05/2022) 30 tablet 11   sildenafil (VIAGRA) 100 MG tablet Take 1 tablet (100 mg total) by mouth daily as needed for erectile dysfunction. (Patient not taking: Reported on 09/05/2022) 10 tablet 0   No facility-administered medications prior to visit.    Allergies  Allergen Reactions   Lisinopril    Hctz [Hydrochlorothiazide] Other (See Comments)    Headache     ROS Review of Systems  Constitutional:  Negative for activity change, appetite change, chills and diaphoresis.  Respiratory:  Negative for cough, chest tightness, shortness of breath and wheezing.   Cardiovascular: Negative.  Negative for chest pain, palpitations and leg swelling.  Musculoskeletal:  Negative for back pain, gait problem and joint swelling.  Skin: Negative.   Neurological:  Negative for dizziness, facial asymmetry, light-headedness, numbness and headaches.  Psychiatric/Behavioral:  Negative for confusion, self-injury, sleep disturbance and suicidal ideas. The patient is not nervous/anxious.       Objective:    Physical Exam Constitutional:      General: He is not in acute distress.    Appearance: Normal appearance. He is not  ill-appearing, toxic-appearing or diaphoretic.  Eyes:     General: No scleral icterus.       Right eye: No discharge.        Left eye: No discharge.     Extraocular Movements: Extraocular movements intact.     Conjunctiva/sclera: Conjunctivae normal.     Pupils: Pupils are equal, round, and reactive to light.  Cardiovascular:     Rate and Rhythm: Regular rhythm. Bradycardia present.     Pulses: Normal pulses.     Heart sounds: Normal heart sounds. No murmur heard.    No friction rub. No gallop.  Pulmonary:     Effort: Pulmonary effort is normal. No respiratory distress.     Breath sounds: Normal breath sounds. No stridor. No wheezing, rhonchi or rales.  Chest:     Chest wall: No tenderness.  Abdominal:     General: There is no distension.     Palpations: Abdomen is soft. There is no mass.     Tenderness: There is no abdominal tenderness.  Musculoskeletal:        General: No swelling, tenderness, deformity or signs of injury.     Right lower leg: No edema.     Left lower leg: No edema.  Skin:    Capillary Refill: Capillary refill takes less than 2 seconds.  Neurological:     Mental Status: He is alert and oriented to person, place, and time.     Cranial Nerves: No cranial nerve deficit.     Motor: No weakness.     Coordination: Coordination normal.     Gait: Gait normal.  Psychiatric:        Mood and Affect: Mood normal.        Behavior: Behavior normal.        Thought Content: Thought content normal.        Judgment: Judgment normal.     BP 129/87   Pulse 61   Temp 97.8 F (36.6 C)   Wt 198 lb (89.8 kg)   SpO2 100%   BMI 26.12 kg/m  Wt Readings from Last 3 Encounters:  09/05/22 198 lb (89.8 kg)  08/24/22 198 lb (89.8 kg)  08/22/22 197 lb 3.2 oz (89.4 kg)    Lab Results  Component Value Date   TSH 1.980 07/13/2020   Lab Results  Component Value Date   WBC 4.2 11/22/2021   HGB 12.9 (L) 11/22/2021   HCT 39.2 11/22/2021   MCV 86 11/22/2021   PLT 278  11/22/2021   Lab Results  Component Value Date   NA 142 08/22/2022   K 4.4 08/22/2022   CO2 26 08/22/2022   GLUCOSE 93 08/22/2022   BUN 20 08/22/2022   CREATININE 1.29 (H) 08/22/2022   BILITOT 0.2 08/22/2022   ALKPHOS 130 (H) 08/22/2022   AST 17  08/22/2022   ALT 15 08/22/2022   PROT 7.6 08/22/2022   ALBUMIN 4.7 08/22/2022   CALCIUM 9.7 08/22/2022   ANIONGAP 6 05/18/2021   EGFR 65 08/22/2022   Lab Results  Component Value Date   CHOL 168 11/22/2021   Lab Results  Component Value Date   HDL 37 (L) 11/22/2021   Lab Results  Component Value Date   LDLCALC 103 (H) 11/22/2021   Lab Results  Component Value Date   TRIG 159 (H) 11/22/2021   Lab Results  Component Value Date   CHOLHDL 4.5 11/22/2021   Lab Results  Component Value Date   HGBA1C 5.3 05/24/2018      Assessment & Plan:   Problem List Items Addressed This Visit       Cardiovascular and Mediastinum   HTN (hypertension)    BP Readings from Last 3 Encounters:  09/05/22 129/87  08/24/22 (!) 144/80  08/22/22 (!) 153/99  Chronic medical condition now well controlled Blood pressure logs from home reviewed blood pressure readings has been less than 130/80. Continue amlodipine 10 mg daily, losartan 25 mg daily DASH diet advised patient encouraged to engage in regular daily exercises at least 150 minutes weekly Follow-up with his PCP in 4 months      Relevant Medications   losartan (COZAAR) 25 MG tablet     Other   Elevated liver enzymes - Primary    Lab Results  Component Value Date   ALT 15 08/22/2022   AST 17 08/22/2022   ALKPHOS 130 (H) 08/22/2022   BILITOT 0.2 08/22/2022  Denies drinking alcohol, abdominal pain Follow up with PCP in 4 months to recheck labs         Meds ordered this encounter  Medications   losartan (COZAAR) 25 MG tablet    Sig: Take 1 tablet (25 mg total) by mouth daily. (Must have office visit for refills)    Dispense:  90 tablet    Refill:  0    Follow-up: No  follow-ups on file.    Renee Rival, FNP

## 2022-09-05 NOTE — Patient Instructions (Addendum)
  Please follow up with your PCP in 4 months as discussed    It is important that you exercise regularly at least 30 minutes 5 times a week as tolerated  Think about what you will eat, plan ahead. Choose " clean, green, fresh or frozen" over canned, processed or packaged foods which are more sugary, salty and fatty. 70 to 75% of food eaten should be vegetables and fruit. Three meals at set times with snacks allowed between meals, but they must be fruit or vegetables. Aim to eat over a 12 hour period , example 7 am to 7 pm, and STOP after  your last meal of the day. Drink water,generally about 64 ounces per day, no other drink is as healthy. Fruit juice is best enjoyed in a healthy way, by EATING the fruit.  Thanks for choosing [facility] we consider it a privelige to serve you.

## 2022-09-05 NOTE — Assessment & Plan Note (Signed)
BP Readings from Last 3 Encounters:  09/05/22 129/87  08/24/22 (!) 144/80  08/22/22 (!) 153/99  Chronic medical condition now well controlled Blood pressure logs from home reviewed blood pressure readings has been less than 130/80. Continue amlodipine 10 mg daily, losartan 25 mg daily DASH diet advised patient encouraged to engage in regular daily exercises at least 150 minutes weekly Follow-up with his PCP in 4 months

## 2022-09-05 NOTE — Assessment & Plan Note (Signed)
Lab Results  Component Value Date   ALT 15 08/22/2022   AST 17 08/22/2022   ALKPHOS 130 (H) 08/22/2022   BILITOT 0.2 08/22/2022  Denies drinking alcohol, abdominal pain Follow up with PCP in 4 months to recheck labs

## 2022-09-07 ENCOUNTER — Other Ambulatory Visit: Payer: Self-pay | Admitting: Nurse Practitioner

## 2022-09-07 ENCOUNTER — Other Ambulatory Visit: Payer: Self-pay

## 2022-09-07 DIAGNOSIS — I10 Essential (primary) hypertension: Secondary | ICD-10-CM

## 2022-09-12 ENCOUNTER — Other Ambulatory Visit: Payer: Self-pay

## 2022-09-12 MED ORDER — AMLODIPINE BESYLATE 10 MG PO TABS
10.0000 mg | ORAL_TABLET | Freq: Every day | ORAL | 0 refills | Status: DC
  Filled 2022-09-12 – 2022-11-19 (×2): qty 90, 90d supply, fill #0

## 2022-10-05 ENCOUNTER — Ambulatory Visit: Payer: Self-pay | Admitting: Nurse Practitioner

## 2022-11-19 ENCOUNTER — Other Ambulatory Visit: Payer: Self-pay | Admitting: Nurse Practitioner

## 2022-11-19 DIAGNOSIS — N529 Male erectile dysfunction, unspecified: Secondary | ICD-10-CM

## 2022-11-21 ENCOUNTER — Other Ambulatory Visit: Payer: Self-pay

## 2022-11-22 ENCOUNTER — Other Ambulatory Visit: Payer: Self-pay

## 2022-11-23 ENCOUNTER — Other Ambulatory Visit: Payer: Self-pay

## 2022-11-25 ENCOUNTER — Other Ambulatory Visit: Payer: Self-pay

## 2022-12-05 ENCOUNTER — Encounter: Payer: Self-pay | Admitting: Nurse Practitioner

## 2022-12-05 ENCOUNTER — Ambulatory Visit (INDEPENDENT_AMBULATORY_CARE_PROVIDER_SITE_OTHER): Payer: Self-pay | Admitting: Nurse Practitioner

## 2022-12-05 ENCOUNTER — Other Ambulatory Visit: Payer: Self-pay

## 2022-12-05 VITALS — BP 136/82 | HR 87 | Temp 98.0°F | Ht 73.0 in | Wt 197.0 lb

## 2022-12-05 DIAGNOSIS — N528 Other male erectile dysfunction: Secondary | ICD-10-CM

## 2022-12-05 DIAGNOSIS — Z1211 Encounter for screening for malignant neoplasm of colon: Secondary | ICD-10-CM

## 2022-12-05 DIAGNOSIS — I10 Essential (primary) hypertension: Secondary | ICD-10-CM

## 2022-12-05 DIAGNOSIS — R748 Abnormal levels of other serum enzymes: Secondary | ICD-10-CM

## 2022-12-05 MED ORDER — SILDENAFIL CITRATE 25 MG PO TABS
25.0000 mg | ORAL_TABLET | Freq: Every day | ORAL | 0 refills | Status: DC | PRN
  Filled 2022-12-05: qty 10, 30d supply, fill #0

## 2022-12-05 NOTE — Patient Instructions (Addendum)
1. Other male erectile dysfunction  - sildenafil (VIAGRA) 25 MG tablet; Take 1 tablet (25 mg total) by mouth daily as needed for erectile dysfunction.  Dispense: 10 tablet; Refill: 0  2. Screening for colon cancer  - Cologuard   It is important that you exercise regularly at least 30 minutes 5 times a week as tolerated  Think about what you will eat, plan ahead. Choose " clean, green, fresh or frozen" over canned, processed or packaged foods which are more sugary, salty and fatty. 70 to 75% of food eaten should be vegetables and fruit. Three meals at set times with snacks allowed between meals, but they must be fruit or vegetables. Aim to eat over a 12 hour period , example 7 am to 7 pm, and STOP after  your last meal of the day. Drink water,generally about 64 ounces per day, no other drink is as healthy. Fruit juice is best enjoyed in a healthy way, by EATING the fruit.  Thanks for choosing [facility] we consider it a privelige to serve you.

## 2022-12-05 NOTE — Progress Notes (Signed)
Established Patient Office Visit  Subjective:  Patient ID: Erik Choi, male    DOB: 1966-08-08  Age: 57 y.o. MRN: [id number]  CC:  Chief Complaint  Patient presents with   Follow-up    HPI Erik Choi is a 57 y.o. male with past medical history of HTN, erectile dysfunction, hyperlipidemia presents for follow-up for his chronic medical conditions.  Patient of  Raul Del, NP.  Patient stated that he would like to be receiving care at this location henceforth.  Hypertension.  Currently on amlodipine 10 mg daily, losartan 25 mg daily, patient reports blood pressure readings of systolic [id number] to [id number] at home.  He currently denies chest pain, dizziness, edema.  Reports compliance with both medications.  Patient stated that he got some vaccines last year for immigration, he was encouraged to bring his vaccines records with him at next visit .      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   Erectile dysfunction    Hypertension 2019    Past Surgical History:  Procedure Laterality Date   HAND SURGERY Left    57 yo    Family History  Problem Relation Age of Onset   Cancer Neg Hx    Diabetes Neg Hx     Social History   Socioeconomic History   Marital status: Single    Spouse name: Not on file   Number of children: Not on file   Years of education: Not on file   Highest education level: Not on file  Occupational History   Not on file  Tobacco Use   Smoking status: Never   Smokeless tobacco: Never  Vaping Use   Vaping Use: Never used  Substance and Sexual Activity   Alcohol use: Never   Drug use: Never   Sexual activity: Yes  Other Topics Concern   Not on file  Social History Narrative   Not on file   Social Determinants of Health   Financial Resource Strain: Not on file  Food Insecurity: Not on file  Transportation Needs: Not on file  Physical Activity: Not on file  Stress: Not on file  Social Connections: Not on file  Intimate  Partner Violence: Not on file    Outpatient Medications Prior to Visit  Medication Sig Dispense Refill   amLODipine (NORVASC) 10 MG tablet Take 1 tablet (10 mg total) by mouth daily. 90 tablet 0   cyclobenzaprine (FLEXERIL) 10 MG tablet Take 1 tablet (10 mg total) by mouth 3 (three) times daily as needed for muscle spasms. 60 tablet 2   Hypromellose (VISTA GONIO DRY EYE RELIEF) 2.5 % SOLN Place 1 drop into both eyes daily. 15 mL 0   losartan (COZAAR) 25 MG tablet Take 1 tablet (25 mg total) by mouth daily. (Must have office visit for refills) 90 tablet 0   meloxicam (MOBIC) 7.5 MG tablet Take 1 tablet (7.5 mg total) by mouth daily. 30 tablet 1   Multiple Vitamin (MULTIVITAMIN WITH MINERALS) TABS tablet Take 1 tablet by mouth daily. 30 tablet 6   sildenafil (VIAGRA) 100 MG tablet Take 1 tablet (100 mg total) by mouth daily as needed for erectile dysfunction. 10 tablet 0   sildenafil (REVATIO) 20 MG tablet Take 1-5 tablets (20-100 mg total) by mouth as needed prior to intercourse. (Patient not taking: Reported on 09/05/2022) 30 tablet 11   No facility-administered medications prior to visit.    Allergies  Allergen Reactions   Lisinopril  Hctz [Hydrochlorothiazide] Other (See Comments)    Headache     ROS Review of Systems  Constitutional: Negative.   Respiratory: Negative.    Cardiovascular: Negative.   Neurological: Negative.   Psychiatric/Behavioral: Negative.        Objective:    Physical Exam Constitutional:      General: He is not in acute distress.    Appearance: Normal appearance. He is not ill-appearing, toxic-appearing or diaphoretic.  Eyes:     General: No scleral icterus.       Right eye: No discharge.        Left eye: No discharge.  Cardiovascular:     Rate and Rhythm: Normal rate and regular rhythm.     Pulses: Normal pulses.     Heart sounds: Normal heart sounds. No murmur heard.    No friction rub. No gallop.  Pulmonary:     Effort: Pulmonary effort  is normal. No respiratory distress.     Breath sounds: Normal breath sounds. No stridor. No wheezing, rhonchi or rales.  Chest:     Chest wall: No tenderness.  Abdominal:     General: There is no distension.     Palpations: Abdomen is soft.     Tenderness: There is no abdominal tenderness. There is no guarding.  Musculoskeletal:        General: No deformity or signs of injury.     Right lower leg: No edema.     Left lower leg: No edema.  Skin:    General: Skin is warm and dry.     Coloration: Skin is not jaundiced or pale.     Findings: No bruising, erythema, lesion or rash.  Neurological:     Mental Status: He is alert and oriented to person, place, and time.     Sensory: No sensory deficit.     Motor: No weakness.     Coordination: Coordination normal.     Gait: Gait normal.  Psychiatric:        Mood and Affect: Mood normal.        Behavior: Behavior normal.        Thought Content: Thought content normal.        Judgment: Judgment normal.     BP 136/82   Pulse 87   Temp 98 F (36.7 C)   Ht '6\' 1"'$  (1.854 m)   Wt 197 lb (89.4 kg)   SpO2 100%   BMI 25.99 kg/m  Wt Readings from Last 3 Encounters:  12/05/22 197 lb (89.4 kg)  09/05/22 198 lb (89.8 kg)  08/24/22 198 lb (89.8 kg)    Lab Results  Component Value Date   TSH 1.980 07/13/2020   Lab Results  Component Value Date   WBC 4.2 11/22/2021   HGB 12.9 (L) 11/22/2021   HCT 39.2 11/22/2021   MCV 86 11/22/2021   PLT 278 11/22/2021   Lab Results  Component Value Date   NA 142 08/22/2022   K 4.4 08/22/2022   CO2 26 08/22/2022   GLUCOSE 93 08/22/2022   BUN 20 08/22/2022   CREATININE 1.29 (H) 08/22/2022   BILITOT 0.2 08/22/2022   ALKPHOS 130 (H) 08/22/2022   AST 17 08/22/2022   ALT 15 08/22/2022   PROT 7.6 08/22/2022   ALBUMIN 4.7 08/22/2022   CALCIUM 9.7 08/22/2022   ANIONGAP 6 05/18/2021   EGFR 65 08/22/2022   Lab Results  Component Value Date   CHOL 168 11/22/2021   Lab Results  Component Value  Date   HDL 37 (L) 11/22/2021   Lab Results  Component Value Date   LDLCALC 103 (H) 11/22/2021   Lab Results  Component Value Date   TRIG 159 (H) 11/22/2021   Lab Results  Component Value Date   CHOLHDL 4.5 11/22/2021   Lab Results  Component Value Date   HGBA1C 5.3 05/24/2018      Assessment & Plan:   Problem List Items Addressed This Visit       Cardiovascular and Mediastinum   HTN (hypertension) - Primary    BP Readings from Last 3 Encounters:  12/05/22 136/82  09/05/22 129/87  08/24/22 (!) 144/80  He is currently on amlodipine 10 mg daily, losartan 25 mg daily Continue current medications DASH diet advised Engage in regular moderate exercises at least 150 minutes weekly      Relevant Medications   sildenafil (VIAGRA) 25 MG tablet     Other   Erectile dysfunction     - sildenafil (VIAGRA) 25 MG tablet; Take 1 tablet (25 mg total) by mouth daily as needed for erectile dysfunction.  Dispense: 10 tablet; Refill: 0        Relevant Medications   sildenafil (VIAGRA) 25 MG tablet   Elevated liver enzymes    Lab Results  Component Value Date   ALT 15 08/22/2022   AST 17 08/22/2022   ALKPHOS 130 (H) 08/22/2022   BILITOT 0.2 08/22/2022   Will recheck labs at next visit Avoid alcohol and excessive use of tylenol       Other Visit Diagnoses     Screening for colon cancer       Relevant Orders   Cologuard       Meds ordered this encounter  Medications   sildenafil (VIAGRA) 25 MG tablet    Sig: Take 1 tablet (25 mg total) by mouth daily as needed for erectile dysfunction.    Dispense:  10 tablet    Refill:  0    Follow-up: Return in about 3 months (around 03/05/2023) for CPE.    Renee Rival, FNP

## 2022-12-05 NOTE — Assessment & Plan Note (Signed)
-   sildenafil (VIAGRA) 25 MG tablet; Take 1 tablet (25 mg total) by mouth daily as needed for erectile dysfunction.  Dispense: 10 tablet; Refill: 0

## 2022-12-05 NOTE — Assessment & Plan Note (Signed)
BP Readings from Last 3 Encounters:  12/05/22 136/82  09/05/22 129/87  08/24/22 (!) 144/80  He is currently on amlodipine 10 mg daily, losartan 25 mg daily Continue current medications DASH diet advised Engage in regular moderate exercises at least 150 minutes weekly

## 2022-12-05 NOTE — Assessment & Plan Note (Signed)
Lab Results  Component Value Date   ALT 15 08/22/2022   AST 17 08/22/2022   ALKPHOS 130 (H) 08/22/2022   BILITOT 0.2 08/22/2022   Will recheck labs at next visit Avoid alcohol and excessive use of tylenol

## 2023-01-09 LAB — COLOGUARD: COLOGUARD: NEGATIVE

## 2023-01-30 ENCOUNTER — Other Ambulatory Visit: Payer: Self-pay

## 2023-01-31 ENCOUNTER — Other Ambulatory Visit: Payer: Self-pay

## 2023-01-31 ENCOUNTER — Other Ambulatory Visit: Payer: Self-pay | Admitting: Nurse Practitioner

## 2023-01-31 DIAGNOSIS — N528 Other male erectile dysfunction: Secondary | ICD-10-CM

## 2023-01-31 MED ORDER — SILDENAFIL CITRATE 25 MG PO TABS
25.0000 mg | ORAL_TABLET | Freq: Every day | ORAL | 0 refills | Status: DC | PRN
  Filled 2023-01-31: qty 10, 30d supply, fill #0

## 2023-01-31 NOTE — Telephone Encounter (Signed)
Please advise KH 

## 2023-02-02 ENCOUNTER — Other Ambulatory Visit: Payer: Self-pay

## 2023-02-13 ENCOUNTER — Other Ambulatory Visit: Payer: Self-pay | Admitting: Nurse Practitioner

## 2023-02-13 ENCOUNTER — Other Ambulatory Visit: Payer: Self-pay | Admitting: Family Medicine

## 2023-02-13 DIAGNOSIS — I10 Essential (primary) hypertension: Secondary | ICD-10-CM

## 2023-02-14 ENCOUNTER — Other Ambulatory Visit: Payer: Self-pay

## 2023-02-14 MED ORDER — LOSARTAN POTASSIUM 25 MG PO TABS
25.0000 mg | ORAL_TABLET | Freq: Every day | ORAL | 0 refills | Status: DC
  Filled 2023-02-14: qty 90, 90d supply, fill #0

## 2023-02-15 ENCOUNTER — Other Ambulatory Visit: Payer: Self-pay

## 2023-02-15 ENCOUNTER — Other Ambulatory Visit: Payer: Self-pay | Admitting: Family Medicine

## 2023-02-15 DIAGNOSIS — I10 Essential (primary) hypertension: Secondary | ICD-10-CM

## 2023-02-15 MED ORDER — AMLODIPINE BESYLATE 10 MG PO TABS
10.0000 mg | ORAL_TABLET | Freq: Every day | ORAL | 0 refills | Status: DC
  Filled 2023-02-15 – 2023-02-23 (×2): qty 30, 30d supply, fill #0

## 2023-02-22 ENCOUNTER — Other Ambulatory Visit: Payer: Self-pay

## 2023-02-23 ENCOUNTER — Other Ambulatory Visit: Payer: Self-pay

## 2023-03-13 ENCOUNTER — Ambulatory Visit (INDEPENDENT_AMBULATORY_CARE_PROVIDER_SITE_OTHER): Payer: Self-pay | Admitting: Nurse Practitioner

## 2023-03-13 ENCOUNTER — Other Ambulatory Visit: Payer: Self-pay

## 2023-03-13 ENCOUNTER — Encounter: Payer: Self-pay | Admitting: Nurse Practitioner

## 2023-03-13 VITALS — BP 137/85 | HR 71 | Temp 97.9°F | Ht 73.0 in | Wt 199.8 lb

## 2023-03-13 DIAGNOSIS — H04123 Dry eye syndrome of bilateral lacrimal glands: Secondary | ICD-10-CM

## 2023-03-13 DIAGNOSIS — Z0000 Encounter for general adult medical examination without abnormal findings: Secondary | ICD-10-CM | POA: Insufficient documentation

## 2023-03-13 DIAGNOSIS — R748 Abnormal levels of other serum enzymes: Secondary | ICD-10-CM

## 2023-03-13 DIAGNOSIS — Z1321 Encounter for screening for nutritional disorder: Secondary | ICD-10-CM

## 2023-03-13 DIAGNOSIS — I10 Essential (primary) hypertension: Secondary | ICD-10-CM

## 2023-03-13 DIAGNOSIS — N529 Male erectile dysfunction, unspecified: Secondary | ICD-10-CM

## 2023-03-13 DIAGNOSIS — H579 Unspecified disorder of eye and adnexa: Secondary | ICD-10-CM

## 2023-03-13 DIAGNOSIS — Z13228 Encounter for screening for other metabolic disorders: Secondary | ICD-10-CM

## 2023-03-13 DIAGNOSIS — Z130 Encounter for screening for diseases of the blood and blood-forming organs and certain disorders involving the immune mechanism: Secondary | ICD-10-CM

## 2023-03-13 DIAGNOSIS — Z1329 Encounter for screening for other suspected endocrine disorder: Secondary | ICD-10-CM

## 2023-03-13 MED ORDER — SILDENAFIL CITRATE 100 MG PO TABS
50.0000 mg | ORAL_TABLET | Freq: Every day | ORAL | 3 refills | Status: DC | PRN
  Filled 2023-03-13 – 2023-06-05 (×2): qty 10, 30d supply, fill #0
  Filled 2023-09-02 – 2023-09-04 (×2): qty 10, 30d supply, fill #1
  Filled 2023-12-03 – 2023-12-04 (×2): qty 10, 30d supply, fill #2
  Filled 2024-03-04: qty 10, 30d supply, fill #3

## 2023-03-13 MED ORDER — AMLODIPINE BESYLATE 10 MG PO TABS
10.0000 mg | ORAL_TABLET | Freq: Every day | ORAL | 1 refills | Status: DC
Start: 2023-03-13 — End: 2023-09-02
  Filled 2023-03-13 – 2023-03-20 (×2): qty 90, 90d supply, fill #0
  Filled 2023-03-20: qty 30, 30d supply, fill #0
  Filled 2023-06-05: qty 90, 90d supply, fill #1

## 2023-03-13 MED ORDER — LOSARTAN POTASSIUM 25 MG PO TABS
25.0000 mg | ORAL_TABLET | Freq: Every day | ORAL | 1 refills | Status: DC
Start: 2023-03-13 — End: 2023-12-03
  Filled 2023-03-13 – 2023-06-05 (×2): qty 90, 90d supply, fill #0
  Filled 2023-09-02 – 2023-09-04 (×2): qty 90, 90d supply, fill #1

## 2023-03-13 NOTE — Assessment & Plan Note (Signed)
BP Readings from Last 3 Encounters:  03/13/23 137/85  12/05/22 136/82  09/05/22 129/87   HTN Controlled . Continue current medications. No changes in management. Discussed DASH diet and dietary sodium restrictions Continue to increase dietary efforts and exercise.  Medications refilled

## 2023-03-13 NOTE — Assessment & Plan Note (Signed)
Viagra 25 mg daily as needed not working Start viagra 50 mg -100 mg daily as needed for erectile dysfunction

## 2023-03-13 NOTE — Assessment & Plan Note (Signed)
Annual exam as documented.  Counseling done include healthy lifestyle involving committing to 150 minutes of exercise per week, heart healthy diet, and attaining healthy weight. The importance of adequate sleep also discussed.  Regular use of seat belt and home safety were also discussed . Changes in health habits are decided on by patient with goals and time frames set for achieving them. Immunization and cancer screening  needs are specifically addressed at this visit.    Routine fasting labs ordered 

## 2023-03-13 NOTE — Assessment & Plan Note (Signed)
Patient does not drink alcohol, denies abdominal pain nausea, vomiting Checking CMP

## 2023-03-13 NOTE — Patient Instructions (Addendum)
Please get your shingles vaccine and Tdap vaccine at the pharmacy Fasting labs next week  1. Screening for endocrine, nutritional, metabolic and immunity disorder  - CMP14+EGFR; Future - Hemoglobin A1c; Future - Lipid panel; Future - TSH; Future - Hepatitis C antibody; Future - PSA; Future  2. Primary hypertension  - amLODipine (NORVASC) 10 MG tablet; Take 1 tablet (10 mg total) by mouth daily. (Must have office visit for refills)  Dispense: 90 tablet; Refill: 1 - losartan (COZAAR) 25 MG tablet; Take 1 tablet (25 mg total) by mouth daily. (Must have office visit for refills)  Dispense: 90 tablet; Refill: 1  3. Erectile dysfunction, unspecified erectile dysfunction type  - sildenafil (VIAGRA) 100 MG tablet; Take 0.5-1 tablets (50-100 mg total) by mouth daily as needed for erectile dysfunction.  Dispense: 10 tablet; Refill: 3  4. Itchy eyes  - CBC with Differential/Platelet; Future - Ambulatory referral to Ophthalmology  5. Annual physical exam   It is important that you exercise regularly at least 30 minutes 5 times a week as tolerated  Think about what you will eat, plan ahead. Choose " clean, green, fresh or frozen" over canned, processed or packaged foods which are more sugary, salty and fatty. 70 to 75% of food eaten should be vegetables and fruit. Three meals at set times with snacks allowed between meals, but they must be fruit or vegetables. Aim to eat over a 12 hour period , example 7 am to 7 pm, and STOP after  your last meal of the day. Drink water,generally about 64 ounces per day, no other drink is as healthy. Fruit juice is best enjoyed in a healthy way, by EATING the fruit.  Thanks for choosing [facility] we consider it a privelige to serve you.

## 2023-03-13 NOTE — Assessment & Plan Note (Signed)
Patient continues to have dry itchy eyes, no redness, pain  or drainage Was encouraged to try other OTC eyedrops like Visine eyedrops Follow-up with ophthalmology

## 2023-03-13 NOTE — Progress Notes (Addendum)
Complete physical exam  Patient: Erik Choi   DOB: Apr 29, 1966   57 y.o. Male  MRN: [id number]  Subjective:    Chief Complaint  Patient presents with   Annual Exam    Argle Salama is a 57 y.o. male who presents today for a complete physical exam. He reports consuming a general diet. The patient does not participate in regular exercise at present. He generally feels well. He reports sleeping well.   Due for shingles vaccine and Tdap vaccine need for both vaccines discussed patient encouraged to get the vaccines at the pharmacy.  Most recent fall risk assessment:    03/13/2023    2:37 PM  Fall Risk   Falls in the past year? 0  Number falls in past yr: 0  Injury with Fall? 0  Risk for fall due to : No Fall Risks  Follow up Falls evaluation completed     Most recent depression screenings:    03/13/2023    2:37 PM 12/05/2022    2:04 PM  PHQ 2/9 Scores  PHQ - 2 Score 0 0        Patient Care Team: Donell Beers, FNP as PCP - General (Nurse Practitioner)   Outpatient Medications Prior to Visit  Medication Sig Note   cyclobenzaprine (FLEXERIL) 10 MG tablet Take 1 tablet (10 mg total) by mouth 3 (three) times daily as needed for muscle spasms.    Hypromellose (VISTA GONIO DRY EYE RELIEF) 2.5 % SOLN Place 1 drop into both eyes daily.    meloxicam (MOBIC) 7.5 MG tablet Take 1 tablet (7.5 mg total) by mouth daily.    Multiple Vitamin (MULTIVITAMIN WITH MINERALS) TABS tablet Take 1 tablet by mouth daily.    [DISCONTINUED] amLODipine (NORVASC) 10 MG tablet Take 1 tablet (10 mg total) by mouth daily. (Must have office visit for refills)    [DISCONTINUED] losartan (COZAAR) 25 MG tablet Take 1 tablet (25 mg total) by mouth daily. (Must have office visit for refills)    [DISCONTINUED] sildenafil (VIAGRA) 25 MG tablet Take 1 tablet (25 mg total) by mouth daily as needed for erectile dysfunction. 03/13/2023: Needs refill   No facility-administered medications prior to visit.     Review of Systems  Constitutional:  Negative for activity change, appetite change, chills, diaphoresis, fatigue, fever and unexpected weight change.  HENT:  Negative for congestion, dental problem, drooling and ear discharge.   Eyes:  Positive for itching and visual disturbance. Negative for pain, discharge and redness.  Respiratory:  Negative for apnea, cough, choking, chest tightness, shortness of breath and wheezing.   Cardiovascular: Negative.  Negative for chest pain, palpitations and leg swelling.  Gastrointestinal:  Negative for abdominal distention, abdominal pain, anal bleeding, blood in stool, constipation, diarrhea and vomiting.  Endocrine: Negative for polydipsia, polyphagia and polyuria.  Genitourinary:  Negative for difficulty urinating, flank pain, frequency, genital sores, penile discharge and penile pain.  Musculoskeletal: Negative.  Negative for arthralgias, back pain, gait problem and joint swelling.  Skin:  Negative for color change, pallor and rash.  Neurological:  Negative for dizziness, facial asymmetry, light-headedness, numbness and headaches.  Psychiatric/Behavioral:  Negative for agitation, behavioral problems, confusion, hallucinations, self-injury, sleep disturbance and suicidal ideas.        Objective:     BP 137/85   Pulse 71   Temp 97.9 F (36.6 C)   Ht 6\' 1"  (1.854 m)   Wt 199 lb 12.8 oz (90.6 kg)   SpO2 100%   BMI  26.36 kg/m    Physical Exam Vitals and nursing note reviewed. Exam conducted with a chaperone present.  Constitutional:      General: He is not in acute distress.    Appearance: Normal appearance. He is obese. He is not ill-appearing, toxic-appearing or diaphoretic.  HENT:     Right Ear: Tympanic membrane, ear canal and external ear normal. There is no impacted cerumen.     Left Ear: Tympanic membrane, ear canal and external ear normal. There is no impacted cerumen.     Nose: Nose normal. No congestion or rhinorrhea.      Mouth/Throat:     Mouth: Mucous membranes are moist.     Pharynx: Oropharynx is clear. No oropharyngeal exudate or posterior oropharyngeal erythema.  Eyes:     General: No scleral icterus.       Right eye: No discharge.        Left eye: No discharge.     Extraocular Movements: Extraocular movements intact.     Conjunctiva/sclera: Conjunctivae normal.  Neck:     Vascular: No carotid bruit.  Cardiovascular:     Rate and Rhythm: Normal rate and regular rhythm.     Pulses: Normal pulses.     Heart sounds: Normal heart sounds. No murmur heard.    No friction rub. No gallop.  Pulmonary:     Effort: Pulmonary effort is normal. No respiratory distress.     Breath sounds: Normal breath sounds. No stridor. No wheezing, rhonchi or rales.  Chest:     Chest wall: No tenderness.  Abdominal:     General: Bowel sounds are normal. There is no distension.     Palpations: Abdomen is soft. There is no mass.     Tenderness: There is no abdominal tenderness. There is no right CVA tenderness, left CVA tenderness, guarding or rebound.     Hernia: No hernia is present.  Musculoskeletal:        General: No swelling, tenderness, deformity or signs of injury.     Cervical back: Normal range of motion and neck supple. No rigidity or tenderness.     Right lower leg: No edema.     Left lower leg: No edema.  Lymphadenopathy:     Cervical: No cervical adenopathy.  Skin:    General: Skin is warm and dry.     Capillary Refill: Capillary refill takes less than 2 seconds.     Coloration: Skin is not jaundiced or pale.     Findings: No bruising, erythema, lesion or rash.  Neurological:     Mental Status: He is alert and oriented to person, place, and time.     Cranial Nerves: No cranial nerve deficit.     Sensory: No sensory deficit.     Motor: No weakness.     Coordination: Coordination normal.     Gait: Gait normal.     Deep Tendon Reflexes: Reflexes normal.  Psychiatric:        Mood and Affect: Mood  normal.        Behavior: Behavior normal.        Thought Content: Thought content normal.        Judgment: Judgment normal.     No results found for any visits on 03/13/23.     Assessment & Plan:    Routine Health Maintenance and Physical Exam   There is no immunization history on file for this patient.  Health Maintenance  Topic Date Due   COVID-19 Vaccine (1) Never  done   DTaP/Tdap/Td (1 - Tdap) Never done   Zoster Vaccines- Shingrix (1 of 2) Never done   INFLUENZA VACCINE  05/11/2023   Hepatitis C Screening  Completed   HIV Screening  Completed   HPV VACCINES  Aged Out   Colonoscopy  Discontinued    Discussed health benefits of physical activity, and encouraged him to engage in regular exercise appropriate for his age and condition.  Problem List Items Addressed This Visit       Cardiovascular and Mediastinum   HTN (hypertension)    BP Readings from Last 3 Encounters:  03/13/23 137/85  12/05/22 136/82  09/05/22 129/87   HTN Controlled . Continue current medications. No changes in management. Discussed DASH diet and dietary sodium restrictions Continue to increase dietary efforts and exercise.  Medications refilled      Relevant Medications   amLODipine (NORVASC) 10 MG tablet   losartan (COZAAR) 25 MG tablet   sildenafil (VIAGRA) 100 MG tablet     Other   Dry eyes    Patient continues to have dry itchy eyes, no redness, pain  or drainage Was encouraged to try other OTC eyedrops like Visine eyedrops Follow-up with ophthalmology      Relevant Orders   Ambulatory referral to Ophthalmology   Erectile dysfunction    Viagra 25 mg daily as needed not working Start viagra 50 mg -100 mg daily as needed for erectile dysfunction      Relevant Medications   sildenafil (VIAGRA) 100 MG tablet   Elevated liver enzymes    Patient does not drink alcohol, denies abdominal pain nausea, vomiting Checking CMP      Annual physical exam - Primary    Annual exam  as documented.  Counseling done include healthy lifestyle involving committing to 150 minutes of exercise per week, heart healthy diet, and attaining healthy weight. The importance of adequate sleep also discussed.  Regular use of seat belt and home safety were also discussed . Changes in health habits are decided on by patient with goals and time frames set for achieving them. Immunization and cancer screening  needs are specifically addressed at this visit.    Routine fasting labs ordered      Other Visit Diagnoses     Screening for endocrine, nutritional, metabolic and immunity disorder       Relevant Orders   CBC with Differential/Platelet   CMP14+EGFR   Hemoglobin A1c   Lipid panel   TSH   Hepatitis C antibody   PSA      Return in about 6 months (around 09/12/2023) for Fasting labs next week, HTN.     Donell Beers, FNP

## 2023-03-20 ENCOUNTER — Other Ambulatory Visit: Payer: Self-pay

## 2023-03-20 ENCOUNTER — Other Ambulatory Visit: Payer: Self-pay | Admitting: Nurse Practitioner

## 2023-03-20 DIAGNOSIS — H579 Unspecified disorder of eye and adnexa: Secondary | ICD-10-CM

## 2023-03-20 DIAGNOSIS — Z1321 Encounter for screening for nutritional disorder: Secondary | ICD-10-CM

## 2023-03-21 LAB — CMP14+EGFR
ALT: 15 IU/L (ref 0–44)
AST: 20 IU/L (ref 0–40)
Albumin/Globulin Ratio: 1.6
Albumin: 4.5 g/dL (ref 3.8–4.9)
Alkaline Phosphatase: 117 IU/L (ref 44–121)
BUN/Creatinine Ratio: 14 (ref 9–20)
BUN: 15 mg/dL (ref 6–24)
Bilirubin Total: 0.4 mg/dL (ref 0.0–1.2)
CO2: 25 mmol/L (ref 20–29)
Calcium: 9.4 mg/dL (ref 8.7–10.2)
Chloride: 102 mmol/L (ref 96–106)
Creatinine, Ser: 1.07 mg/dL (ref 0.76–1.27)
Globulin, Total: 2.8 g/dL (ref 1.5–4.5)
Glucose: 98 mg/dL (ref 70–99)
Potassium: 4.3 mmol/L (ref 3.5–5.2)
Sodium: 139 mmol/L (ref 134–144)
Total Protein: 7.3 g/dL (ref 6.0–8.5)
eGFR: 81 mL/min/{1.73_m2} (ref >59)

## 2023-03-21 LAB — CBC WITH DIFFERENTIAL/PLATELET
Basophils Absolute: 0 10*3/uL (ref 0.0–0.2)
Basos: 1 %
EOS (ABSOLUTE): 0.2 10*3/uL (ref 0.0–0.4)
Eos: 5 %
Hematocrit: 42.2 % (ref 37.5–51.0)
Hemoglobin: 13.2 g/dL (ref 13.0–17.7)
Immature Grans (Abs): 0 10*3/uL (ref 0.0–0.1)
Immature Granulocytes: 0 %
Lymphocytes Absolute: 1.8 10*3/uL (ref 0.7–3.1)
Lymphs: 45 %
MCH: 27.6 pg (ref 26.6–33.0)
MCHC: 31.3 g/dL — ABNORMAL LOW (ref 31.5–35.7)
MCV: 88 fL (ref 79–97)
Monocytes Absolute: 0.3 10*3/uL (ref 0.1–0.9)
Monocytes: 8 %
Neutrophils Absolute: 1.6 10*3/uL (ref 1.4–7.0)
Neutrophils: 41 %
Platelets: 251 10*3/uL (ref 150–450)
RBC: 4.78 x10E6/uL (ref 4.14–5.80)
RDW: 13.5 % (ref 11.6–15.4)
WBC: 4 10*3/uL (ref 3.4–10.8)

## 2023-03-21 LAB — HEMOGLOBIN A1C
Est. average glucose Bld gHb Est-mCnc: 123 mg/dL
Hgb A1c MFr Bld: 5.9 % — ABNORMAL HIGH (ref 4.8–5.6)

## 2023-03-21 LAB — TSH: TSH: 0.977 u[IU]/mL (ref 0.450–4.500)

## 2023-03-21 LAB — LIPID PANEL
Chol/HDL Ratio: 3.9 ratio (ref 0.0–5.0)
Cholesterol, Total: 180 mg/dL (ref 100–199)
HDL: 46 mg/dL (ref >39)
LDL Chol Calc (NIH): 124 mg/dL — ABNORMAL HIGH (ref 0–99)
Triglycerides: 50 mg/dL (ref 0–149)
VLDL Cholesterol Cal: 10 mg/dL (ref 5–40)

## 2023-03-21 LAB — HEPATITIS C ANTIBODY: Hep C Virus Ab: NONREACTIVE

## 2023-03-21 LAB — PSA: Prostate Specific Ag, Serum: 0.9 ng/mL (ref 0.0–4.0)

## 2023-06-05 ENCOUNTER — Other Ambulatory Visit: Payer: Self-pay

## 2023-06-06 ENCOUNTER — Other Ambulatory Visit: Payer: Self-pay

## 2023-06-27 IMAGING — CT CT ABDOMEN W/ CM
2 of 9 series · 9 of 46 positions shown, 15 images · IV contrast (OMNIPAQUE)
Comparison: CT May 02, 2018 and MRI August 18, 2020

CLINICAL DATA: Follow-up known right renal lesion.

EXAM:
CT ABDOMEN WITH CONTRAST
TECHNIQUE: Multidetector CT imaging of the abdomen was performed using the
standard protocol following bolus administration of intravenous
contrast.
CONTRAST:  75mL OMNIPAQUE IOHEXOL 350 MG/ML SOLN

[Series 3: coronal arterial · coronal · arterial · 0.49mm/px · 3 of 95 slices shown, 4 images]
[im 24/95  soft-tissue]
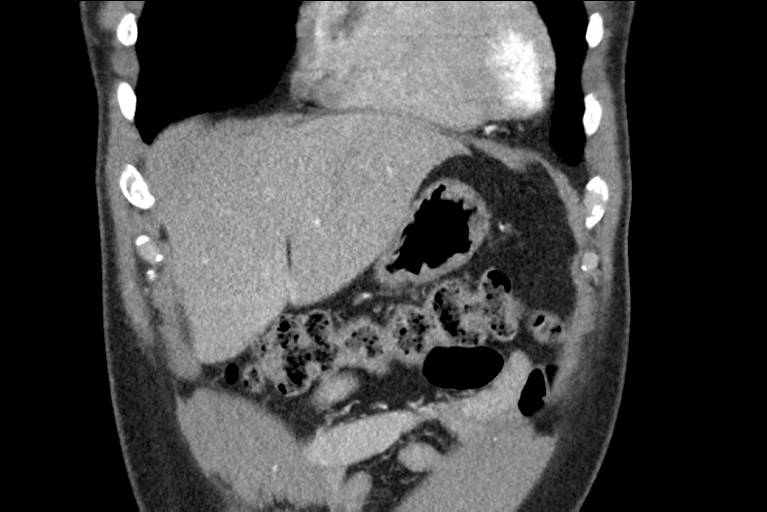
[im 48/95  soft-tissue]
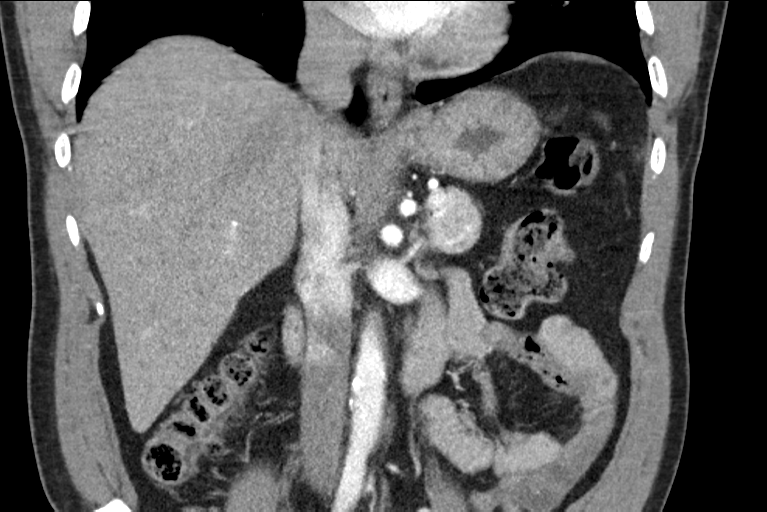
[im 48/95  bone]
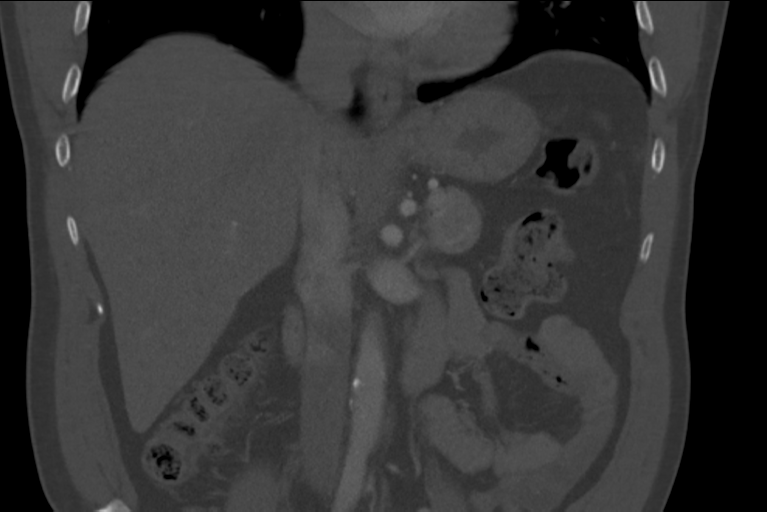
[im 71/95  soft-tissue]
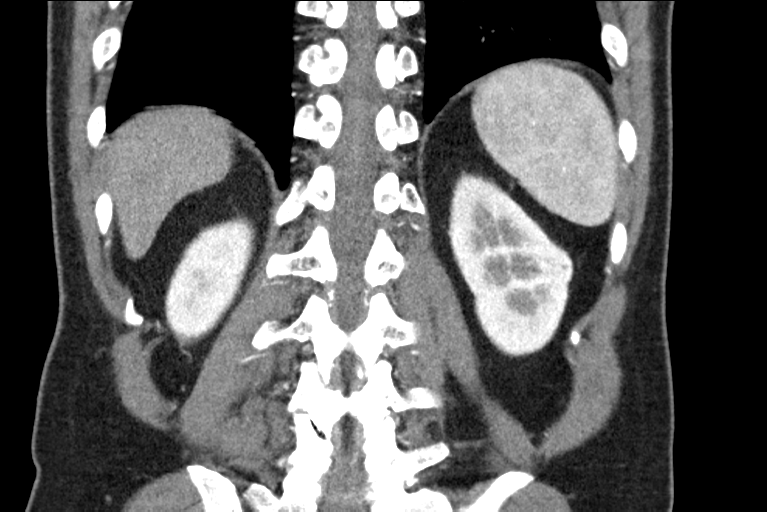

[Series 7: axial nephro · axial · 0.70mm/px · z∈[-253,-73]mm · 6 of 85 slices shown, 11 images]
[im 13/85  soft-tissue]
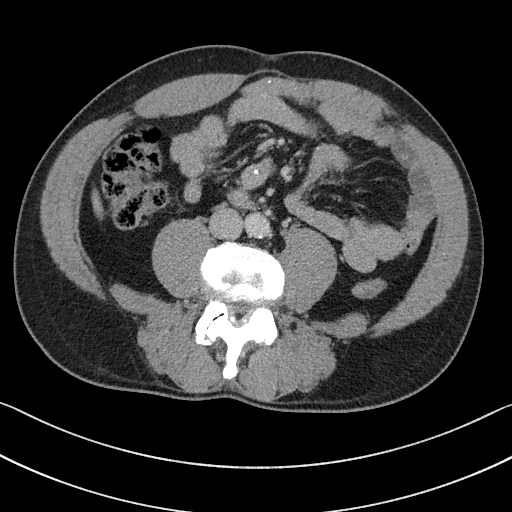
[im 13/85  bone]
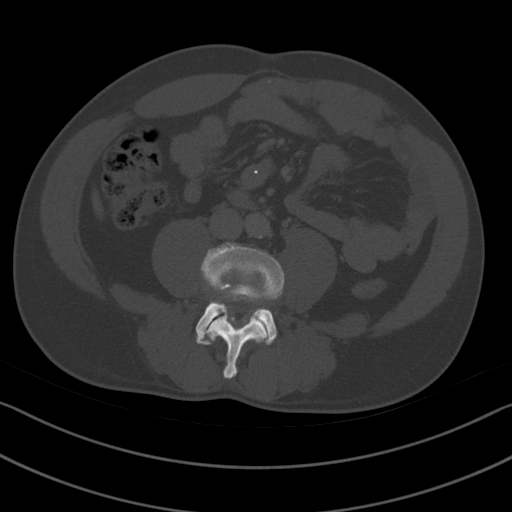
[im 25/85  soft-tissue]
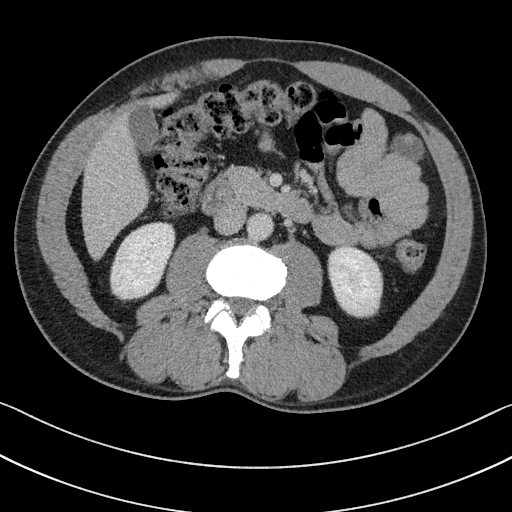
[im 37/85  soft-tissue]
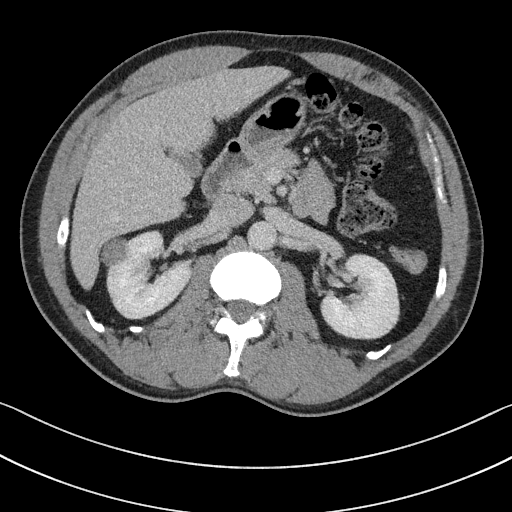
[im 37/85  lung]
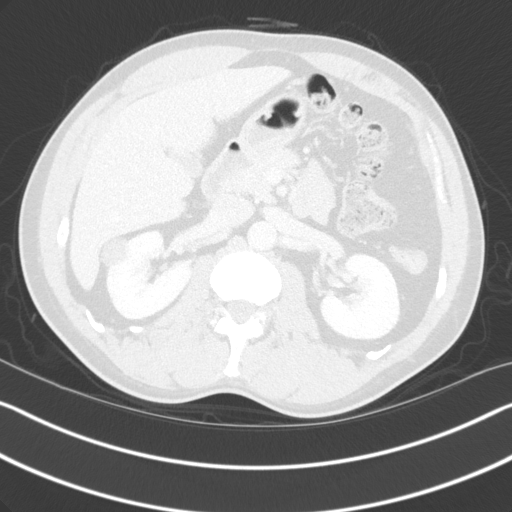
[im 49/85  soft-tissue]
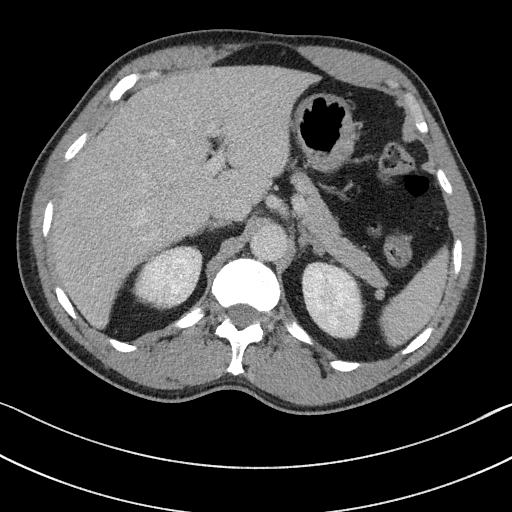
[im 49/85  lung]
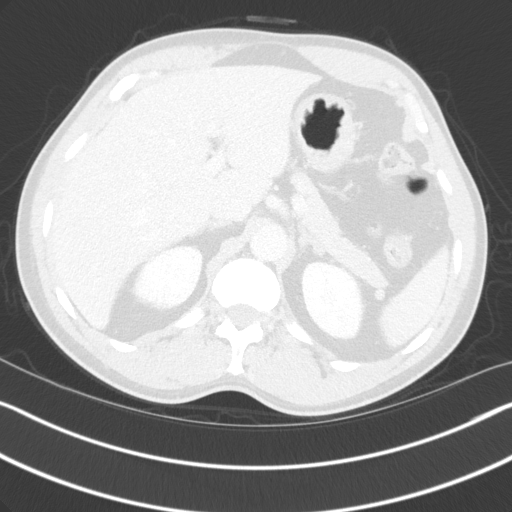
[im 61/85  soft-tissue]
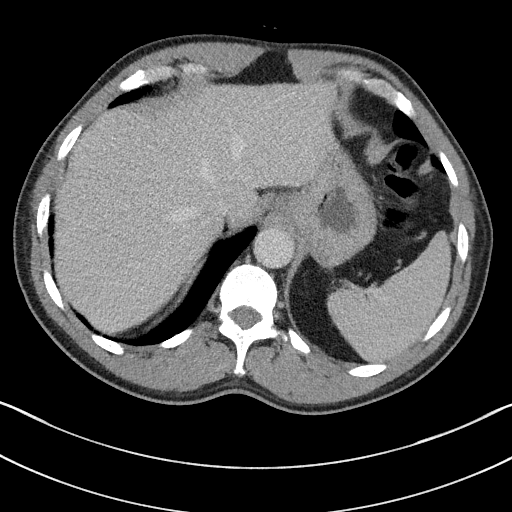
[im 61/85  lung]
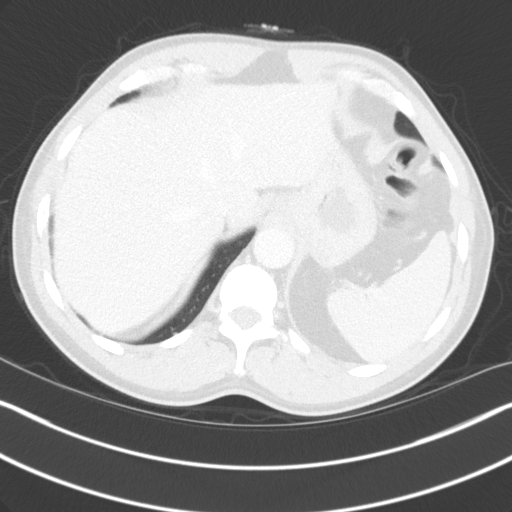
[im 73/85  soft-tissue]
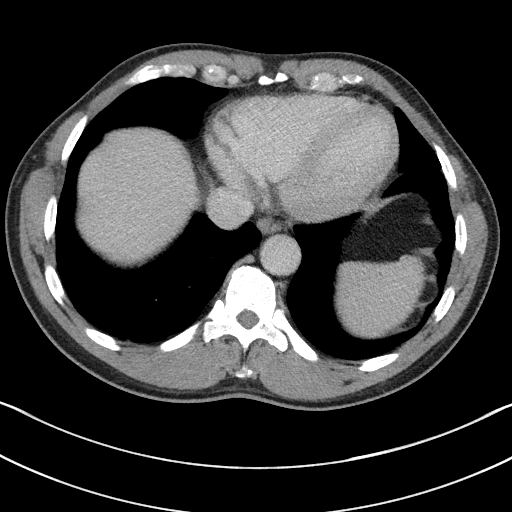
[im 73/85  lung]
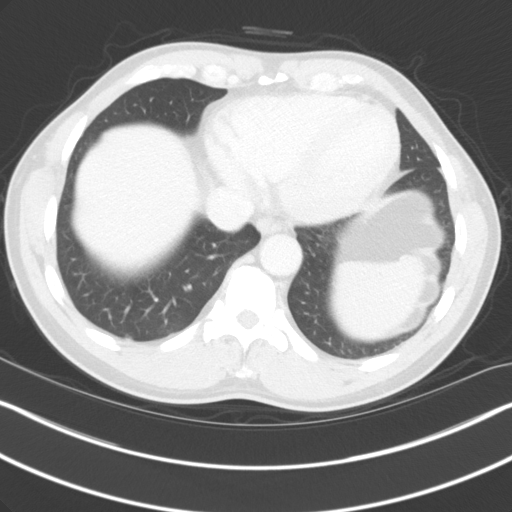

[9 of 46 positions shown; findings below may reference images not displayed]

FINDINGS: Lower chest: No acute abnormality.

Hepatobiliary: There is a 1 cm arterially enhancing focus in segment
VI of the liver on image 38/2, which equilibrates to background
liver on more delayed phase of contrast. In retrospect this was
subtly evident on prior MRI August 18, 2020 and is not
significantly changed in size having measured 9 mm on that
examination. On that MRI there was no corresponding abnormal T2 or
intrinsic T1 signal and no reduced diffusivity. Similarly on that
examination the lesion equal rated to background liver on more
delayed phase of contrast. Given the imaging characteristics and
approximate 1 year stability this is favored represent a benign
intrahepatic shunt. Gallbladder is unremarkable. No biliary ductal
dilation.

Pancreas: No pancreatic ductal dilation or evidence of acute
inflammation.

Spleen: Within normal limits.

Adrenals/Urinary Tract: Mild nodular thickening of the bilateral
adrenal glands is unchanged from May 02, 2018.

Similar size of the right subcapsular interpolar renal lesion, which
appears solid on prior MRI with low-grade homogeneous contrast
enhancement, on today's examination measuring 2.5 x 2.1 cm on image
45/2 previously 2.6 x 2.1 cm. Evaluation for contrast enhancement is
limited on this study given the lack of non contrasted phase imaging
however on postcontrast arterial Hounsfield units measure 40,
nephrographic phase Hounsfield units of 53 and excretory phase
Hounsfield units of 46. No evidence of tumor in vein.

No hydronephrosis.  Left kidney is unremarkable.

Stomach/Bowel: Stomach is within normal limits. No evidence of bowel
wall thickening, distention, or inflammatory changes.

Vascular/Lymphatic: Aortic atherosclerosis. No abdominal aortic
aneurysm. No pathologically enlarged abdominal lymph nodes.

Other: No abdominal ascites.  No discrete peritoneal nodularity.

Musculoskeletal: Mild multilevel degenerative changes spine. No
aggressive lytic or blastic lesion of bone.
IMPRESSION: 1. Stable 2.5 cm right interpolar renal lesion, which given the
imaging characteristics on today's examination on prior MRI is again
suspicious for a solid low grade papillary renal cell carcinoma. No
definite evidence of abdominal metastatic disease or tumor in vein.
2. Arterially enhancing 1 cm hepatic segment VI focus, that
equilibrates to background liver on sequential phases of delayed
postcontrast imaging, which in retrospect was subtly evident on
prior MRI August 18, 2020 and is not significantly changed in size
having measured 9 mm on that examination. On that MRI there was no
corresponding abnormal T2 or intrinsic T1 signal and no reduced
diffusivity. Additionally on that study the focus also equilibrated
to background liver on more delayed phases of contrast. Given the
imaging characteristics between the 2 studies and approximate 1 year
stability this is favored to reflect a benign intrahepatic shunt.
Attention on follow-up imaging is suggested.

## 2023-08-21 ENCOUNTER — Ambulatory Visit
Admission: RE | Admit: 2023-08-21 | Discharge: 2023-08-21 | Disposition: A | Discharge status: Home / Self Care | Payer: Self-pay | Source: Ambulatory Visit | Attending: Urology | Admitting: Urology

## 2023-08-21 DIAGNOSIS — N289 Disorder of kidney and ureter, unspecified: Secondary | ICD-10-CM | POA: Insufficient documentation

## 2023-08-22 ENCOUNTER — Encounter: Payer: Self-pay | Admitting: Urology

## 2023-08-22 ENCOUNTER — Ambulatory Visit (INDEPENDENT_AMBULATORY_CARE_PROVIDER_SITE_OTHER): Payer: Self-pay | Admitting: Urology

## 2023-08-22 VITALS — BP 110/70 | HR 60 | Ht 73.0 in | Wt 198.0 lb

## 2023-08-22 DIAGNOSIS — N289 Disorder of kidney and ureter, unspecified: Secondary | ICD-10-CM

## 2023-08-22 DIAGNOSIS — N5203 Combined arterial insufficiency and corporo-venous occlusive erectile dysfunction: Secondary | ICD-10-CM

## 2023-08-22 DIAGNOSIS — N2889 Other specified disorders of kidney and ureter: Secondary | ICD-10-CM

## 2023-08-22 NOTE — Progress Notes (Signed)
I,Amy L Pierron,acting as a scribe for Vanna Scotland, MD.,have documented all relevant documentation on the behalf of Vanna Scotland, MD,as directed by  Vanna Scotland, MD while in the presence of Vanna Scotland, MD.  08/22/2023 2:55 PM   Erik Choi October 10, 1966 [id number]  Referring provider: Claiborne Rigg, NP [address] [place] [place],  [place] [address]  Chief Complaint  Patient presents with   Other    HPI: 57 year-old male with a personal history of right renal mass and erectile dysfunction presents today for follow-up with renal ultrasound.  He was first diagnosed with a renal mass in 2019. Since then, he's had numerous evaluations, most fully characterized on a CT scan in 2021 measuring 2.6 centimeters. 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 At one point he had been scheduled for surgical excision, but ultimately elected surveillance. He's had no change in the interim.   Renal ultrasound from 08/21/2023 again shows stability of the exophytic solid renal mass on the right, measuring 2.2 centimeters unchanged from the previous year.   He is doing well overall with no new symptoms or complaints.    PMH: Past Medical History:  Diagnosis Date   Cancer (HCC) 2019   renal   Erectile dysfunction    Hypertension 2019    Surgical History: Past Surgical History:  Procedure Laterality Date   HAND SURGERY Left    57 yo    Home Medications:  Allergies as of 08/22/2023       Reactions   Lisinopril    Hctz [hydrochlorothiazide] Other (See Comments)   Headache        Medication List        Accurate as of August 22, 2023  2:55 PM. If you have any questions, ask your nurse or doctor.          amLODipine 10 MG tablet Commonly known as: NORVASC Take 1 tablet (10 mg total) by mouth daily. (Must have office visit for refills)   cyclobenzaprine 10 MG tablet Commonly known as: FLEXERIL Take 1 tablet (10 mg total) by mouth 3  (three) times daily as needed for muscle spasms.   losartan 25 MG tablet Commonly known as: COZAAR Take 1 tablet (25 mg total) by mouth daily. (Must have office visit for refills)   meloxicam 7.5 MG tablet Commonly known as: MOBIC Take 1 tablet (7.5 mg total) by mouth daily.   multivitamin with minerals Tabs tablet Take 1 tablet by mouth daily.   sildenafil 100 MG tablet Commonly known as: Viagra Take 0.5-1 tablets (50-100 mg total) by mouth daily as needed for erectile dysfunction.   Vista Gonio Dry Eye Relief 2.5 % Soln Generic drug: Hypromellose Place 1 drop into both eyes daily.        Allergies:  Allergies  Allergen Reactions   Lisinopril    Hctz [Hydrochlorothiazide] Other (See Comments)    Headache     Family History: Family History  Problem Relation Age of Onset   Diabetes Father    Diabetes Sister    Cancer Neg Hx    Stroke Neg Hx    Heart attack Neg Hx     Social History:  reports that he has never smoked. He has never used smokeless tobacco. He reports that he does not drink alcohol and does not use drugs.   Physical Exam: BP 110/70   Pulse 60   Ht 6\' 1"  (1.854 m)   Wt 198 lb (89.8 kg)  BMI 26.12 kg/m   Constitutional:  Alert and oriented, No acute distress. HEENT: [place] AT, moist mucus membranes.  Trachea midline, no masses. Neurologic: Grossly intact, no focal deficits, moving all 4 extremities. Psychiatric: Normal mood and affect.   Pertinent Imaging: Results for orders placed during the hospital encounter of 08/21/23  US RENAL  Narrative CLINICAL DATA:  Follow-up right renal mass.  EXAM: RENAL / URINARY TRACT ULTRASOUND COMPLETE  COMPARISON:  08/02/2022 renal sonogram. 07/27/2021 CT abdomen. 08/18/2020 MRI abdomen.  FINDINGS: Right Kidney:  Renal measurements: 10.9 x 6.3 x 5.8 cm = volume: 206 mL. No hydronephrosis. Exophytic solid 2.1 x 2.0 x 2.2 cm interpolar right renal mass, previously 2.6 x 2.2 x 2.4 cm on 08/02/2022  sonogram, not appreciably changed. Separate tiny simple interpolar right renal 0.7 x 0.8 x 0.8 cm cyst, for which no follow-up imaging is recommended.  Left Kidney:  Renal measurements: 10.6 x 6.9 x 5.9 cm = volume: 225 mL. Echogenicity within normal limits. No mass or hydronephrosis visualized.  Bladder:  Appears normal for degree of bladder distention. Bilateral ureteral jets seen in the bladder.  Other:  None.  IMPRESSION: Exophytic solid 2.2 cm interpolar right renal mass, not appreciably changed since 08/02/2022 sonogram, previously described as suspicious for low grade papillary renal cell carcinoma on 08/18/2020 MRI. Continued annual imaging surveillance warranted.  Electronically Signed By: Delbert Phenix M.D. On: 08/22/2023 10:50 Personally reviewed the above images and agree with radiologic interpretation.   Assessment & Plan:    1. Right renal mass  - Stable. Favor continued elective surveillance given the absence of interval change for 6 years.  2. Erectile dysfunction  - Continue Sildenafil as needed.   Return in about 1 year (around 08/21/2024) for renal ultrasound.  [facility] [address], [address] [place], [place] [address] [phone]

## 2023-09-02 ENCOUNTER — Other Ambulatory Visit: Payer: Self-pay | Admitting: Nurse Practitioner

## 2023-09-02 DIAGNOSIS — G8929 Other chronic pain: Secondary | ICD-10-CM

## 2023-09-02 DIAGNOSIS — I10 Essential (primary) hypertension: Secondary | ICD-10-CM

## 2023-09-04 ENCOUNTER — Other Ambulatory Visit: Payer: Self-pay

## 2023-09-04 ENCOUNTER — Other Ambulatory Visit (HOSPITAL_COMMUNITY): Payer: Self-pay | Admitting: Nurse Practitioner

## 2023-09-04 DIAGNOSIS — M545 Low back pain, unspecified: Secondary | ICD-10-CM

## 2023-09-04 MED ORDER — MELOXICAM 7.5 MG PO TABS
7.5000 mg | ORAL_TABLET | Freq: Every day | ORAL | 0 refills | Status: DC | PRN
  Filled 2023-09-04: qty 30, 30d supply, fill #0

## 2023-09-04 MED ORDER — CYCLOBENZAPRINE HCL 10 MG PO TABS
10.0000 mg | ORAL_TABLET | Freq: Three times a day (TID) | ORAL | 0 refills | Status: DC | PRN
  Filled 2023-09-04: qty 60, 20d supply, fill #0

## 2023-09-04 MED ORDER — AMLODIPINE BESYLATE 10 MG PO TABS
10.0000 mg | ORAL_TABLET | Freq: Every day | ORAL | 1 refills | Status: DC
  Filled 2023-09-04: qty 90, 90d supply, fill #0
  Filled 2023-12-03 – 2023-12-04 (×2): qty 90, 90d supply, fill #1

## 2023-09-05 ENCOUNTER — Other Ambulatory Visit: Payer: Self-pay

## 2023-09-12 ENCOUNTER — Ambulatory Visit: Payer: Self-pay | Admitting: Nurse Practitioner

## 2023-09-13 ENCOUNTER — Ambulatory Visit: Payer: Self-pay | Admitting: Nurse Practitioner

## 2023-09-19 ENCOUNTER — Encounter: Payer: Self-pay | Admitting: Nurse Practitioner

## 2023-09-19 ENCOUNTER — Ambulatory Visit (INDEPENDENT_AMBULATORY_CARE_PROVIDER_SITE_OTHER): Payer: Self-pay | Admitting: Nurse Practitioner

## 2023-09-19 VITALS — BP 140/82 | HR 70 | Temp 97.1°F | Wt 195.6 lb

## 2023-09-19 DIAGNOSIS — I10 Essential (primary) hypertension: Secondary | ICD-10-CM

## 2023-09-19 NOTE — Progress Notes (Signed)
Established Patient Office Visit  Subjective:  Patient ID: Erik Choi, male    DOB: 24-Dec-1965  Age: 57 y.o. MRN: [id number]  CC:  Chief Complaint  Patient presents with   Hypertension    May need labs, missed lab appointment, pt stated that he took his BP medications two hours ago at 6am.    HPI Erik Choi is a 57 y.o. male  has a past medical history of Cancer (HCC) (2019), Erectile dysfunction, and Hypertension (2019).  Patient present for follow-up for his chronic medical conditions   Hypertension.  Currently on amlodipine 10 mg daily, losartan 25 mg daily.  Patient states that his blood pressure has been well-controlled at home, he denies chest pain shortness of breath edema.    Patient encouraged to get flu vaccine shingles vaccine and Tdap vaccine at the pharmacy  Patient denies any adverse reactions to current medications     Past Medical History:  Diagnosis Date   Cancer (HCC) 2019   renal   Erectile dysfunction    Hypertension 2019    Past Surgical History:  Procedure Laterality Date   HAND SURGERY Left    57 yo    Family History  Problem Relation Age of Onset   Diabetes Father    Diabetes Sister    Cancer Neg Hx    Stroke Neg Hx    Heart attack Neg Hx     Social History   Socioeconomic History   Marital status: Single    Spouse name: Not on file   Number of children: 2   Years of education: Not on file   Highest education level: Not on file  Occupational History   Not on file  Tobacco Use   Smoking status: Never   Smokeless tobacco: Never  Vaping Use   Vaping status: Never Used  Substance and Sexual Activity   Alcohol use: Never   Drug use: Never   Sexual activity: Yes  Other Topics Concern   Not on file  Social History Narrative   Lives with his girlfriend.   Social Determinants of Health   Financial Resource Strain: Not on file  Food Insecurity: Not on file  Transportation Needs: Not on file  Physical Activity: Not on file   Stress: Not on file  Social Connections: Not on file  Intimate Partner Violence: Not on file    Outpatient Medications Prior to Visit  Medication Sig Dispense Refill   amLODipine (NORVASC) 10 MG tablet Take 1 tablet (10 mg total) by mouth daily. (Must have office visit for refills) 90 tablet 1   cyclobenzaprine (FLEXERIL) 10 MG tablet Take 1 tablet (10 mg total) by mouth 3 (three) times daily as needed for muscle spasms. 60 tablet 0   Hypromellose (VISTA GONIO DRY EYE RELIEF) 2.5 % SOLN Place 1 drop into both eyes daily. 15 mL 0   losartan (COZAAR) 25 MG tablet Take 1 tablet (25 mg total) by mouth daily. (Must have office visit for refills) 90 tablet 1   meloxicam (MOBIC) 7.5 MG tablet Take 1 tablet (7.5 mg total) by mouth daily as needed for pain. 30 tablet 0   Multiple Vitamin (MULTIVITAMIN WITH MINERALS) TABS tablet Take 1 tablet by mouth daily. 30 tablet 6   sildenafil (VIAGRA) 100 MG tablet Take 0.5-1 tablets (50-100 mg total) by mouth daily as needed for erectile dysfunction. 10 tablet 3   No facility-administered medications prior to visit.    Allergies  Allergen Reactions   Lisinopril  Hctz [Hydrochlorothiazide] Other (See Comments)    Headache     ROS Review of Systems  Constitutional:  Negative for appetite change, chills, fatigue and fever.  HENT:  Negative for congestion, postnasal drip, rhinorrhea and sneezing.   Respiratory:  Negative for cough, shortness of breath and wheezing.   Cardiovascular:  Negative for chest pain, palpitations and leg swelling.  Gastrointestinal:  Negative for abdominal pain, constipation, nausea and vomiting.  Genitourinary:  Negative for difficulty urinating, dysuria, flank pain and frequency.  Musculoskeletal:  Negative for arthralgias, back pain, joint swelling and myalgias.  Skin:  Negative for color change, pallor, rash and wound.  Neurological:  Negative for dizziness, facial asymmetry, weakness, numbness and headaches.   Psychiatric/Behavioral:  Negative for behavioral problems, confusion, self-injury and suicidal ideas.       Objective:    Physical Exam Vitals and nursing note reviewed.  Constitutional:      General: He is not in acute distress.    Appearance: Normal appearance. He is not ill-appearing, toxic-appearing or diaphoretic.  HENT:     Mouth/Throat:     Mouth: Mucous membranes are moist.     Pharynx: Oropharynx is clear. No oropharyngeal exudate or posterior oropharyngeal erythema.  Eyes:     General: No scleral icterus.       Right eye: No discharge.        Left eye: No discharge.     Extraocular Movements: Extraocular movements intact.     Conjunctiva/sclera: Conjunctivae normal.  Cardiovascular:     Rate and Rhythm: Normal rate and regular rhythm.     Pulses: Normal pulses.     Heart sounds: Normal heart sounds. No murmur heard.    No friction rub. No gallop.  Pulmonary:     Effort: Pulmonary effort is normal. No respiratory distress.     Breath sounds: Normal breath sounds. No stridor. No wheezing, rhonchi or rales.  Chest:     Chest wall: No tenderness.  Abdominal:     General: There is no distension.     Palpations: Abdomen is soft.     Tenderness: There is no abdominal tenderness. There is no right CVA tenderness, left CVA tenderness or guarding.  Musculoskeletal:        General: No swelling, tenderness, deformity or signs of injury.     Right lower leg: No edema.     Left lower leg: No edema.  Skin:    General: Skin is warm and dry.     Capillary Refill: Capillary refill takes less than 2 seconds.     Coloration: Skin is not jaundiced or pale.     Findings: No bruising, erythema or lesion.  Neurological:     Mental Status: He is alert and oriented to person, place, and time.     Motor: No weakness.     Coordination: Coordination normal.     Gait: Gait normal.  Psychiatric:        Mood and Affect: Mood normal.        Behavior: Behavior normal.        Thought  Content: Thought content normal.        Judgment: Judgment normal.     BP (!) 140/82   Pulse 70   Temp (!) 97.1 F (36.2 C)   Wt 195 lb 9.6 oz (88.7 kg)   SpO2 98%   BMI 25.81 kg/m  Wt Readings from Last 3 Encounters:  09/19/23 195 lb 9.6 oz (88.7 kg)  08/22/23 198 lb (  89.8 kg)  03/13/23 199 lb 12.8 oz (90.6 kg)    Lab Results  Component Value Date   TSH 0.977 03/20/2023   Lab Results  Component Value Date   WBC 4.0 03/20/2023   HGB 13.2 03/20/2023   HCT 42.2 03/20/2023   MCV 88 03/20/2023   PLT 251 03/20/2023   Lab Results  Component Value Date   NA 139 03/20/2023   K 4.3 03/20/2023   CO2 25 03/20/2023   GLUCOSE 98 03/20/2023   BUN 15 03/20/2023   CREATININE 1.07 03/20/2023   BILITOT 0.4 03/20/2023   ALKPHOS 117 03/20/2023   AST 20 03/20/2023   ALT 15 03/20/2023   PROT 7.3 03/20/2023   ALBUMIN 4.5 03/20/2023   CALCIUM 9.4 03/20/2023   ANIONGAP 6 05/18/2021   EGFR 81 03/20/2023   Lab Results  Component Value Date   CHOL 180 03/20/2023   Lab Results  Component Value Date   HDL 46 03/20/2023   Lab Results  Component Value Date   LDLCALC 124 (H) 03/20/2023   Lab Results  Component Value Date   TRIG 50 03/20/2023   Lab Results  Component Value Date   CHOLHDL 3.9 03/20/2023   Lab Results  Component Value Date   HGBA1C 5.9 (H) 03/20/2023      Assessment & Plan:   Problem List Items Addressed This Visit       Cardiovascular and Mediastinum   HTN (hypertension) - Primary    BP Readings from Last 3 Encounters:  09/19/23 (!) 140/82  08/22/23 110/70  03/13/23 137/85   HTN Blood pressure is uncontrolled in the office today Continue current medications.  Discussed DASH diet and dietary sodium restrictions Continue to increase dietary efforts and exercise.  BMP today Nurse visit in 2 weeks to recheck BP       Relevant Orders   Basic metabolic panel   Recheck vitals    No orders of the defined types were placed in this  encounter.   Follow-up: Return in about 4 months (around 01/18/2024) for HTN.    Donell Beers, FNP

## 2023-09-19 NOTE — Assessment & Plan Note (Addendum)
BP Readings from Last 3 Encounters:  09/19/23 (!) 140/82  08/22/23 110/70  03/13/23 137/85   HTN Blood pressure is uncontrolled in the office today but has been previously well-controlled Continue current medications.  Discussed DASH diet and dietary sodium restrictions Continue to increase dietary efforts and exercise.  BMP today Nurse visit in 2 weeks to recheck BP, encouraged to monitor blood pressure and to keep a log and bring to that appointment

## 2023-09-19 NOTE — Patient Instructions (Addendum)
Please consider getting Influenza , Shingrix and Tdap vaccine at local pharmacy.    Around 3 times per week, check your blood pressure 2 times per day. once in the morning and once in the evening. The readings should be at least one minute apart. Write down these values and bring them to your next nurse visit/appointment.  When you check your BP, make sure you have been doing something calm/relaxing 5 minutes prior to checking. Both feet should be flat on the floor and you should be sitting. Use your left arm and make sure it is in a relaxed position (on a table), and that the cuff is at the approximate level/height of your heart.    It is important that you exercise regularly at least 30 minutes 5 times a week as tolerated  Think about what you will eat, plan ahead. Choose " clean, green, fresh or frozen" over canned, processed or packaged foods which are more sugary, salty and fatty. 70 to 75% of food eaten should be vegetables and fruit. Three meals at set times with snacks allowed between meals, but they must be fruit or vegetables. Aim to eat over a 12 hour period , example 7 am to 7 pm, and STOP after  your last meal of the day. Drink water,generally about 64 ounces per day, no other drink is as healthy. Fruit juice is best enjoyed in a healthy way, by EATING the fruit.  Thanks for choosing [facility] we consider it a privelige to serve you.

## 2023-09-20 LAB — BASIC METABOLIC PANEL
BUN/Creatinine Ratio: 14 (ref 9–20)
BUN: 17 mg/dL (ref 6–24)
CO2: 26 mmol/L (ref 20–29)
Calcium: 9.6 mg/dL (ref 8.7–10.2)
Chloride: 104 mmol/L (ref 96–106)
Creatinine, Ser: 1.22 mg/dL (ref 0.76–1.27)
Glucose: 95 mg/dL (ref 70–99)
Potassium: 4.3 mmol/L (ref 3.5–5.2)
Sodium: 140 mmol/L (ref 134–144)
eGFR: 70 mL/min/{1.73_m2} (ref >59)

## 2023-09-20 NOTE — Progress Notes (Signed)
My chart message was sent to pt after confirming last activity date. kh

## 2023-10-03 ENCOUNTER — Ambulatory Visit (INDEPENDENT_AMBULATORY_CARE_PROVIDER_SITE_OTHER): Payer: Self-pay

## 2023-10-03 VITALS — BP 125/74 | HR 70 | Wt 200.0 lb

## 2023-10-03 DIAGNOSIS — I10 Essential (primary) hypertension: Secondary | ICD-10-CM

## 2023-10-03 NOTE — Progress Notes (Signed)
   Blood Pressure Recheck Visit  Name: Erik Choi MRN: [id number] Date of Birth: [birth year]  Kamarrion Schoessow presents today for Blood Pressure recheck with clinical support staff.  Order for BP recheck by Edwin Dada, FNP, ordered on 09/19/23.   BP Readings from Last 3 Encounters:  09/19/23 (!) 140/82  08/22/23 110/70  03/13/23 137/85    Current Outpatient Medications  Medication Sig Dispense Refill   amLODipine (NORVASC) 10 MG tablet Take 1 tablet (10 mg total) by mouth daily. (Must have office visit for refills) 90 tablet 1   cyclobenzaprine (FLEXERIL) 10 MG tablet Take 1 tablet (10 mg total) by mouth 3 (three) times daily as needed for muscle spasms. 60 tablet 0   Hypromellose (VISTA GONIO DRY EYE RELIEF) 2.5 % SOLN Place 1 drop into both eyes daily. 15 mL 0   losartan (COZAAR) 25 MG tablet Take 1 tablet (25 mg total) by mouth daily. (Must have office visit for refills) 90 tablet 1   meloxicam (MOBIC) 7.5 MG tablet Take 1 tablet (7.5 mg total) by mouth daily as needed for pain. 30 tablet 0   Multiple Vitamin (MULTIVITAMIN WITH MINERALS) TABS tablet Take 1 tablet by mouth daily. 30 tablet 6   sildenafil (VIAGRA) 100 MG tablet Take 0.5-1 tablets (50-100 mg total) by mouth daily as needed for erectile dysfunction. 10 tablet 3   No current facility-administered medications for this visit.    Hypertensive Medication Review: Patient states that they are taking all their hypertensive medications as prescribed and their last dose of hypertensive medications was this morning   Documentation of any medication adherence discrepancies: none  Provider Recommendation:  Spoke to [place] and they stated: Pt is to check b/p at home and continue to take medication.   Patient has been scheduled to follow up with 02/02/24   Patient has been given provider's recommendations and does not have any questions or concerns at this time. Patient will contact the office for any future questions or  concerns.   Renelda Loma RMA

## 2023-12-03 ENCOUNTER — Other Ambulatory Visit: Payer: Self-pay | Admitting: Nurse Practitioner

## 2023-12-03 DIAGNOSIS — I10 Essential (primary) hypertension: Secondary | ICD-10-CM

## 2023-12-04 ENCOUNTER — Other Ambulatory Visit: Payer: Self-pay

## 2023-12-04 MED ORDER — LOSARTAN POTASSIUM 25 MG PO TABS
25.0000 mg | ORAL_TABLET | Freq: Every day | ORAL | 1 refills | Status: DC
  Filled 2023-12-04 (×2): qty 90, 90d supply, fill #0
  Filled 2024-03-04: qty 90, 90d supply, fill #1

## 2023-12-05 ENCOUNTER — Other Ambulatory Visit: Payer: Self-pay

## 2024-01-23 ENCOUNTER — Ambulatory Visit (INDEPENDENT_AMBULATORY_CARE_PROVIDER_SITE_OTHER): Payer: Self-pay | Admitting: Nurse Practitioner

## 2024-01-23 ENCOUNTER — Ambulatory Visit: Payer: Self-pay | Admitting: Nurse Practitioner

## 2024-01-23 ENCOUNTER — Encounter: Payer: Self-pay | Admitting: Nurse Practitioner

## 2024-01-23 ENCOUNTER — Other Ambulatory Visit: Payer: Self-pay

## 2024-01-23 VITALS — BP 124/78 | HR 74 | Temp 97.5°F | Wt 203.0 lb

## 2024-01-23 DIAGNOSIS — N529 Male erectile dysfunction, unspecified: Secondary | ICD-10-CM

## 2024-01-23 DIAGNOSIS — I10 Essential (primary) hypertension: Secondary | ICD-10-CM

## 2024-01-23 MED ORDER — AMLODIPINE BESYLATE 10 MG PO TABS
10.0000 mg | ORAL_TABLET | Freq: Every day | ORAL | 1 refills | Status: DC
Start: 2024-01-23 — End: 2024-07-01
  Filled 2024-01-23 – 2024-03-04 (×2): qty 90, 90d supply, fill #0
  Filled 2024-06-14 – 2024-06-15 (×2): qty 90, 90d supply, fill #1

## 2024-01-23 NOTE — Patient Instructions (Signed)
 Please consider getting Shingrix and Tdap vaccine at local pharmacy.   It is important that you exercise regularly at least 30 minutes 5 times a week as tolerated  Think about what you will eat, plan ahead. Choose " clean, green, fresh or frozen" over canned, processed or packaged foods which are more sugary, salty and fatty. 70 to 75% of food eaten should be vegetables and fruit. Three meals at set times with snacks allowed between meals, but they must be fruit or vegetables. Aim to eat over a 12 hour period , example 7 am to 7 pm, and STOP after  your last meal of the day. Drink water,generally about 64 ounces per day, no other drink is as healthy. Fruit juice is best enjoyed in a healthy way, by EATING the fruit.  Thanks for choosing [facility] we consider it a privelige to serve you.

## 2024-01-23 NOTE — Assessment & Plan Note (Signed)
 Continue Viagra 50 mg to 100 mg daily as needed

## 2024-01-23 NOTE — Progress Notes (Signed)
 New Patient Office Visit  Subjective:  Patient ID: Erik Choi, male    DOB: 1966-09-20  Age: 58 y.o. MRN: [id number]  CC:  Chief Complaint  Patient presents with   Hypertension    HPI Erik Choi is a 58 y.o. male   has a past medical history of Cancer (HCC) (2019), Erectile dysfunction, and Hypertension (2019).  Patient presents for follow-up for his chronic medical conditions  Hypertension.  Currently on amlodipine 10 mg daily, losartan 25 mg daily.  Patient denies chest pain shortness of breath edema.  Needs a blood pressure monitor. The patients has a diagnosis of hypertension and it is medically necessary for them to have access to a home device to monitor blood pressure.  The patient does not have readily available insurance access to a device and cannot afford to purchase a device at this time.  The patient has been counseled that they do not need to continue to receive services from [facility] to receive a device.  The patient will be given a device free of charge.    Due for shingles vaccine and Tdap vaccine.  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   Erectile dysfunction    Hypertension 2019    Past Surgical History:  Procedure Laterality Date   HAND SURGERY Left    58 yo    Family History  Problem Relation Age of Onset   Diabetes Father    Diabetes Sister    Cancer Neg Hx    Stroke Neg Hx    Heart attack Neg Hx     Social History   Socioeconomic History   Marital status: Single    Spouse name: Not on file   Number of children: 2   Years of education: Not on file   Highest education level: Not on file  Occupational History   Not on file  Tobacco Use   Smoking status: Never   Smokeless tobacco: Never  Vaping Use   Vaping status: Never Used  Substance and Sexual Activity   Alcohol use: Never   Drug use: Never   Sexual activity: Yes  Other  Topics Concern   Not on file  Social History Narrative   Lives with his girlfriend.   Social Drivers of Corporate investment banker Strain: Not on file  Food Insecurity: Not on file  Transportation Needs: Not on file  Physical Activity: Not on file  Stress: Not on file  Social Connections: Not on file  Intimate Partner Violence: Not on file    ROS Review of Systems  Constitutional:  Negative for appetite change, chills, fatigue and fever.  HENT:  Negative for congestion, postnasal drip, rhinorrhea and sneezing.   Respiratory:  Negative for cough, shortness of breath and wheezing.   Cardiovascular:  Negative for chest pain, palpitations and leg swelling.  Gastrointestinal:  Negative for abdominal pain, constipation, nausea and vomiting.  Genitourinary:  Negative for difficulty urinating, dysuria, flank pain and frequency.  Musculoskeletal:  Negative for arthralgias, back pain, joint swelling and myalgias.  Skin:  Negative for color change, pallor, rash and wound.  Neurological:  Negative for dizziness, facial asymmetry, weakness, numbness and headaches.  Psychiatric/Behavioral:  Negative for behavioral problems, confusion, self-injury and suicidal ideas.     Objective:   Today's Vitals: BP 124/78   Pulse 74   Temp (!)  97.5 F (36.4 C)   Wt 203 lb (92.1 kg)   SpO2 100%   BMI 26.78 kg/m   Physical Exam Vitals and nursing note reviewed.  Constitutional:      General: He is not in acute distress.    Appearance: Normal appearance. He is not ill-appearing, toxic-appearing or diaphoretic.  HENT:     Mouth/Throat:     Mouth: Mucous membranes are moist.     Pharynx: Oropharynx is clear. No oropharyngeal exudate or posterior oropharyngeal erythema.  Eyes:     General: No scleral icterus.       Right eye: No discharge.        Left eye: No discharge.     Extraocular Movements: Extraocular movements intact.     Conjunctiva/sclera: Conjunctivae normal.  Cardiovascular:      Rate and Rhythm: Normal rate and regular rhythm.     Pulses: Normal pulses.     Heart sounds: Normal heart sounds. No murmur heard.    No friction rub. No gallop.  Pulmonary:     Effort: Pulmonary effort is normal. No respiratory distress.     Breath sounds: Normal breath sounds. No stridor. No wheezing, rhonchi or rales.  Chest:     Chest wall: No tenderness.  Abdominal:     General: There is no distension.     Palpations: Abdomen is soft.     Tenderness: There is no abdominal tenderness. There is no right CVA tenderness, left CVA tenderness or guarding.  Musculoskeletal:        General: No swelling, tenderness, deformity or signs of injury.     Right lower leg: No edema.     Left lower leg: No edema.  Skin:    General: Skin is warm and dry.     Capillary Refill: Capillary refill takes less than 2 seconds.     Coloration: Skin is not jaundiced or pale.     Findings: No bruising, erythema or lesion.  Neurological:     Mental Status: He is alert and oriented to person, place, and time.     Motor: No weakness.     Coordination: Coordination normal.     Gait: Gait normal.  Psychiatric:        Mood and Affect: Mood normal.        Behavior: Behavior normal.        Thought Content: Thought content normal.        Judgment: Judgment normal.     Assessment & Plan:   Problem List Items Addressed This Visit       Cardiovascular and Mediastinum   HTN (hypertension)   Chronic medical condition currently well-controlled Continue amlodipine 10 mg daily, losartan 25 mg daily DASH diet and commitment to daily physical activity for a minimum of 30 minutes discussed and encouraged, as a part of hypertension management. BMP today Blood pressure monitoring device provided Follow-up in 6 months     01/23/2024    9:46 AM 10/03/2023   10:04 AM 10/03/2023    9:49 AM 09/19/2023    8:52 AM 09/19/2023    8:26 AM 09/19/2023    8:23 AM 08/22/2023    2:39 PM  BP/Weight  Systolic BP 124 125  139 140 142 143 110  Diastolic BP 78 74 77 82 95 92 70  Wt. (Lbs) 203  200   195.6 198  BMI 26.78 kg/m2  26.39 kg/m2   25.81 kg/m2 26.12 kg/m2  Relevant Medications   amLODipine (NORVASC) 10 MG tablet   Other Relevant Orders   Basic Metabolic Panel     Other   Erectile dysfunction - Primary   Continue Viagra 50 mg to 100 mg daily as needed       Outpatient Encounter Medications as of 01/23/2024  Medication Sig   cyclobenzaprine (FLEXERIL) 10 MG tablet Take 1 tablet (10 mg total) by mouth 3 (three) times daily as needed for muscle spasms.   Hypromellose (VISTA GONIO DRY EYE RELIEF) 2.5 % SOLN Place 1 drop into both eyes daily.   losartan (COZAAR) 25 MG tablet Take 1 tablet (25 mg total) by mouth daily. (Must have office visit for refills)   meloxicam (MOBIC) 7.5 MG tablet Take 1 tablet (7.5 mg total) by mouth daily as needed for pain.   Multiple Vitamin (MULTIVITAMIN WITH MINERALS) TABS tablet Take 1 tablet by mouth daily.   sildenafil (VIAGRA) 100 MG tablet Take 0.5-1 tablets (50-100 mg total) by mouth daily as needed for erectile dysfunction.   [DISCONTINUED] amLODipine (NORVASC) 10 MG tablet Take 1 tablet (10 mg total) by mouth daily. (Must have office visit for refills)   amLODipine (NORVASC) 10 MG tablet Take 1 tablet (10 mg total) by mouth daily. (Must have office visit for refills)   No facility-administered encounter medications on file as of 01/23/2024.    Follow-up: Return in about 6 months (around 07/24/2024) for CPE.   Zayonna Ayuso R Kelleigh Skerritt, FNP

## 2024-01-23 NOTE — Assessment & Plan Note (Signed)
 Chronic medical condition currently well-controlled Continue amlodipine 10 mg daily, losartan 25 mg daily DASH diet and commitment to daily physical activity for a minimum of 30 minutes discussed and encouraged, as a part of hypertension management. BMP today Blood pressure monitoring device provided Follow-up in 6 months     01/23/2024    9:46 AM 10/03/2023   10:04 AM 10/03/2023    9:49 AM 09/19/2023    8:52 AM 09/19/2023    8:26 AM 09/19/2023    8:23 AM 08/22/2023    2:39 PM  BP/Weight  Systolic BP 124 125 139 140 142 143 110  Diastolic BP 78 74 77 82 95 92 70  Wt. (Lbs) 203  200   195.6 198  BMI 26.78 kg/m2  26.39 kg/m2   25.81 kg/m2 26.12 kg/m2

## 2024-01-24 LAB — BASIC METABOLIC PANEL WITH GFR
BUN/Creatinine Ratio: 13 (ref 9–20)
BUN: 18 mg/dL (ref 6–24)
CO2: 24 mmol/L (ref 20–29)
Calcium: 9.6 mg/dL (ref 8.7–10.2)
Chloride: 101 mmol/L (ref 96–106)
Creatinine, Ser: 1.44 mg/dL — ABNORMAL HIGH (ref 0.76–1.27)
Glucose: 94 mg/dL (ref 70–99)
Potassium: 4.5 mmol/L (ref 3.5–5.2)
Sodium: 137 mmol/L (ref 134–144)
eGFR: 57 mL/min/{1.73_m2} — ABNORMAL LOW (ref >59)

## 2024-03-05 ENCOUNTER — Other Ambulatory Visit: Payer: Self-pay

## 2024-03-11 ENCOUNTER — Other Ambulatory Visit: Payer: Self-pay

## 2024-05-06 ENCOUNTER — Ambulatory Visit: Payer: Self-pay | Admitting: Nurse Practitioner

## 2024-06-14 ENCOUNTER — Other Ambulatory Visit: Payer: Self-pay | Admitting: Nurse Practitioner

## 2024-06-14 DIAGNOSIS — M545 Low back pain, unspecified: Secondary | ICD-10-CM

## 2024-06-14 DIAGNOSIS — I10 Essential (primary) hypertension: Secondary | ICD-10-CM

## 2024-06-15 ENCOUNTER — Other Ambulatory Visit: Payer: Self-pay

## 2024-06-17 ENCOUNTER — Other Ambulatory Visit: Payer: Self-pay

## 2024-06-17 MED ORDER — LOSARTAN POTASSIUM 25 MG PO TABS
25.0000 mg | ORAL_TABLET | Freq: Every day | ORAL | 1 refills | Status: DC
  Filled 2024-06-17: qty 90, 90d supply, fill #0

## 2024-06-17 MED ORDER — MELOXICAM 7.5 MG PO TABS
7.5000 mg | ORAL_TABLET | Freq: Every day | ORAL | 0 refills | Status: DC | PRN
  Filled 2024-06-17: qty 30, 30d supply, fill #0

## 2024-07-01 ENCOUNTER — Ambulatory Visit: Payer: Self-pay

## 2024-07-01 ENCOUNTER — Encounter: Payer: Self-pay | Admitting: Nurse Practitioner

## 2024-07-01 ENCOUNTER — Other Ambulatory Visit: Payer: Self-pay

## 2024-07-01 ENCOUNTER — Ambulatory Visit (INDEPENDENT_AMBULATORY_CARE_PROVIDER_SITE_OTHER): Payer: Self-pay | Admitting: Nurse Practitioner

## 2024-07-01 VITALS — BP 146/99 | HR 67 | Wt 201.0 lb

## 2024-07-01 DIAGNOSIS — R519 Headache, unspecified: Secondary | ICD-10-CM | POA: Insufficient documentation

## 2024-07-01 DIAGNOSIS — M7989 Other specified soft tissue disorders: Secondary | ICD-10-CM | POA: Insufficient documentation

## 2024-07-01 DIAGNOSIS — R42 Dizziness and giddiness: Secondary | ICD-10-CM | POA: Insufficient documentation

## 2024-07-01 DIAGNOSIS — R200 Anesthesia of skin: Secondary | ICD-10-CM | POA: Insufficient documentation

## 2024-07-01 DIAGNOSIS — R7303 Prediabetes: Secondary | ICD-10-CM | POA: Insufficient documentation

## 2024-07-01 DIAGNOSIS — I10 Essential (primary) hypertension: Secondary | ICD-10-CM

## 2024-07-01 MED ORDER — AMLODIPINE BESYLATE 10 MG PO TABS
10.0000 mg | ORAL_TABLET | Freq: Every day | ORAL | 2 refills | Status: AC
  Filled 2024-07-01 – 2024-08-19 (×2): qty 90, 90d supply, fill #0

## 2024-07-01 MED ORDER — LOSARTAN POTASSIUM 50 MG PO TABS
50.0000 mg | ORAL_TABLET | Freq: Every day | ORAL | 1 refills | Status: DC
  Filled 2024-07-15: qty 90, 90d supply, fill #0

## 2024-07-01 MED ORDER — LOSARTAN POTASSIUM 50 MG PO TABS
50.0000 mg | ORAL_TABLET | Freq: Every day | ORAL | 0 refills | Status: DC
  Filled 2024-07-01: qty 90, 90d supply, fill #0

## 2024-07-01 NOTE — Assessment & Plan Note (Signed)
 Left hand numbness Intermittent numbness in the left hand for three weeks, with increased severity today. Potential stroke symptoms warrant immediate evaluation. Could also be due to overuse  - Seek immediate evaluation at the emergency room due to potential stroke symptoms, patient declined

## 2024-07-01 NOTE — Telephone Encounter (Signed)
 Pt worked in to [employer] . [facility]

## 2024-07-01 NOTE — Patient Instructions (Signed)
 1. Primary hypertension   - losartan  (COZAAR ) 50 MG tablet; Take 1 tablet (50 mg total) by mouth daily.  Dispense: 90 tablet; Refill: 0   3. Soft tissue mass (Primary)  - US  CHEST SOFT TISSUE; Future   Around 3 times per week, check your blood pressure 2 times per day. once in the morning and once in the evening. The readings should be at least one minute apart. Write down these values and bring them to your next nurse visit/appointment.  When you check your BP, make sure you have been doing something calm/relaxing 5 minutes prior to checking. Both feet should be flat on the floor and you should be sitting. Use your left arm and make sure it is in a relaxed position (on a table), and that the cuff is at the approximate level/height of your heart. Blood pressure goal is less than 130/80   It is important that you exercise regularly at least 30 minutes 5 times a week as tolerated  Think about what you will eat, plan ahead. Choose  clean, green, fresh or frozen over canned, processed or packaged foods which are more sugary, salty and fatty. 70 to 75% of food eaten should be vegetables and fruit. Three meals at set times with snacks allowed between meals, but they must be fruit or vegetables. Aim to eat over a 12 hour period , example 7 am to 7 pm, and STOP after  your last meal of the day. Drink water,generally about 64 ounces per day, no other drink is as healthy. Fruit juice is best enjoyed in a healthy way, by EATING the fruit.  Thanks for choosing [facility] we consider it a privelige to serve you.

## 2024-07-01 NOTE — Assessment & Plan Note (Signed)
 Headache Intermittent headaches for three weeks, currently mild. Potential stroke symptoms warrant immediate evaluation. -advised to Seek immediate evaluation at the emergency room due to potential stroke symptoms but the patient declined  - Uses acetaminophen  for headache relief as needed. Importance of adequate rest and hydration discussed

## 2024-07-01 NOTE — Assessment & Plan Note (Signed)
 Lab Results  Component Value Date   HGBA1C 5.9 (H) 03/20/2023  Rechecking labs

## 2024-07-01 NOTE — Assessment & Plan Note (Addendum)
 He reported mild intermittent dizziness ,could be due to uncontrolled hypertension, stress Checking CBC, CMP

## 2024-07-01 NOTE — Telephone Encounter (Signed)
  FYI Only or Action Required?: FYI only for provider.  Patient was last seen in primary care on 01/23/2024 by Paseda, Folashade R, FNP.  Called Nurse Triage reporting Headache and Numbness.  Symptoms began several weeks ago.  Interventions attempted: Rest, hydration, or home remedies.  Symptoms are: unchanged.  Triage Disposition: See HCP Within 4 Hours (Or PCP Triage)  Patient/caregiver understands and will follow disposition?: Yes  Copied from CRM #[id number]. Topic: Clinical - Red Word Triage >> Jul 01, 2024 11:47 AM Erik Choi wrote: [facility] that prompted transfer to Nurse Triage: Patient is having numbness in his left arm and it has been going on 3-4 weeks. He is also having persistent headaches. Reason for Disposition  [1] Tingling (e.g., pins and needles) of the face, arm / hand, or leg / foot on one side of the body AND [2] present now  (Exceptions: Chronic/recurrent symptom lasting > 4 weeks; or from known cause, such as: bumped elbow, carpal tunnel, pinched nerve.)  Answer Assessment - Initial Assessment Questions Additional info: No appointments are available in practice or regional location until October, advised urgent care, patient is concerned with cost associated with urgent care and prefers office visit. After discussing concern for symptoms patient is agreeable to urgent care evaluation.    1. SYMPTOM: What is the main symptom you are concerned about? (e.g., weakness, numbness)     Numbness in left arm  2. ONSET: When did this start? (e.g., minutes, hours, days; while sleeping)     3-4 weeks ago  3. LAST NORMAL: When was the last time you (the patient) were normal (no symptoms)?     4 weeks ago 4. PATTERN Does this come and go, or has it been constant since it started?  Is it present now?     Intermittent  5. CARDIAC SYMPTOMS: Have you had any of the following symptoms: chest pain, difficulty breathing, palpitations?     Denies  6. NEUROLOGIC SYMPTOMS:  Have you had any of the following symptoms: headache, dizziness, vision loss, double vision, changes in speech, unsteady on your feet?     Intermittent headaches with arm tingling-none at this time 7. OTHER SYMPTOMS: Do you have any other symptoms?     Denies  Protocols used: Neurologic Deficit-A-AH

## 2024-07-01 NOTE — Assessment & Plan Note (Signed)
 BP Readings from Last 3 Encounters:  07/01/24 (!) 146/99  01/23/24 124/78  10/03/23 125/74  Currently uncontrolled Continue amlodipine  10 mg daily, increase losartan  to 25 mg daily CMP today Discussed DASH diet and dietary sodium restrictions Continue to increase dietary efforts and exercise.
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
## 2024-07-02 ENCOUNTER — Ambulatory Visit: Payer: Self-pay | Admitting: Nurse Practitioner

## 2024-07-02 LAB — CMP14+EGFR
ALT: 18 IU/L (ref 0–44)
AST: 19 IU/L (ref 0–40)
Albumin: 4.4 g/dL (ref 3.8–4.9)
Alkaline Phosphatase: 108 IU/L (ref 47–123)
BUN/Creatinine Ratio: 16 (ref 9–20)
BUN: 19 mg/dL (ref 6–24)
Bilirubin Total: 0.3 mg/dL (ref 0.0–1.2)
CO2: 25 mmol/L (ref 20–29)
Calcium: 9.2 mg/dL (ref 8.7–10.2)
Chloride: 102 mmol/L (ref 96–106)
Creatinine, Ser: 1.2 mg/dL (ref 0.76–1.27)
Globulin, Total: 2.4 g/dL (ref 1.5–4.5)
Glucose: 82 mg/dL (ref 70–99)
Potassium: 4.1 mmol/L (ref 3.5–5.2)
Sodium: 139 mmol/L (ref 134–144)
Total Protein: 6.8 g/dL (ref 6.0–8.5)
eGFR: 71 mL/min/1.73 (ref >59)

## 2024-07-02 LAB — CBC
Hematocrit: 38.8 % (ref 37.5–51.0)
Hemoglobin: 12.6 g/dL — ABNORMAL LOW (ref 13.0–17.7)
MCH: 29.2 pg (ref 26.6–33.0)
MCHC: 32.5 g/dL (ref 31.5–35.7)
MCV: 90 fL (ref 79–97)
Platelets: 264 x10E3/uL (ref 150–450)
RBC: 4.32 x10E6/uL (ref 4.14–5.80)
RDW: 13.1 % (ref 11.6–15.4)
WBC: 5 x10E3/uL (ref 3.4–10.8)

## 2024-07-02 LAB — HEMOGLOBIN A1C
Est. average glucose Bld gHb Est-mCnc: 117 mg/dL
Hgb A1c MFr Bld: 5.7 % — ABNORMAL HIGH (ref 4.8–5.6)

## 2024-07-03 ENCOUNTER — Other Ambulatory Visit: Payer: Self-pay

## 2024-07-08 ENCOUNTER — Other Ambulatory Visit: Payer: Self-pay

## 2024-07-08 MED ORDER — PREDNISONE 20 MG PO TABS
40.0000 mg | ORAL_TABLET | Freq: Every day | ORAL | 0 refills | Status: DC
  Filled 2024-07-08: qty 8, 4d supply, fill #0

## 2024-07-09 ENCOUNTER — Other Ambulatory Visit: Payer: Self-pay

## 2024-07-10 ENCOUNTER — Other Ambulatory Visit: Payer: Self-pay

## 2024-07-12 ENCOUNTER — Ambulatory Visit: Payer: Self-pay

## 2024-07-12 ENCOUNTER — Other Ambulatory Visit: Payer: Self-pay | Admitting: Nurse Practitioner

## 2024-07-12 DIAGNOSIS — I10 Essential (primary) hypertension: Secondary | ICD-10-CM

## 2024-07-12 NOTE — Telephone Encounter (Signed)
 FYI Only or Action Required?: FYI only for provider.  Patient was last seen in primary care on 07/01/2024 by Paseda, Folashade R, FNP.  Called Nurse Triage reporting Numbness.  Symptoms began several weeks ago.  Interventions attempted: Prescription medications: Prednisone, higher BP meds.  Symptoms are: gradually worsening.  Triage Disposition: Home Care  Patient/caregiver understands and will follow disposition?: Yes Reason for Disposition  [1] Numbness or tingling on both sides of body AND [2] is a new symptom present < 24 hours  Answer Assessment - Initial Assessment Questions Told PCP about this on 9/22, was advised ED, patient did not go to ED. Work transferred patient to [place]  and [facility] on 9/29, EKG, chest xray, blood work patient stated they said everything was normal, given a steroid and sent him home. Called ambulance this morning and went to different ED again, BP over 200/xx last night in ED. Denies swelling or discoloration. B12 low, red blood cell count was low. Walking 3-4 minutes and has to sit down to due feeling weak. Called [facility], spoke to Kerrick, no sooner appointments available for patient. Already has appointment scheduled for 10/14, added patient to wait list. Advised patient to return to ED if symptoms worsen.   1. SYMPTOM: What is the main symptom you are concerned about? (e.g., weakness, numbness)     Left arm numbness, now going into left foot   2. ONSET: When did this start? (e.g., minutes, hours, days; while sleeping)     A few weeks ago,  3. LAST NORMAL: When was the last time you (the patient) were normal (no symptoms)?     Weeks ago  4. PATTERN Does this come and go, or has it been constant since it started?  Is it present now?     Constant   5. CARDIAC SYMPTOMS: Have you had any of the following symptoms: chest pain, difficulty breathing, palpitations?     Felt heart racing, like it was  going to come out of chest this morning when going to ED   6. NEUROLOGIC SYMPTOMS: Have you had any of the following symptoms: headache, dizziness, vision loss, double vision, changes in speech, unsteady on your feet?     Dizziness, lightheaded, feeling unsteady on feet like a child just started walking   7. OTHER SYMPTOMS: Do you have any other symptoms?     Dizziness, lightheaded.  Protocols used: Neurologic [facility]  Copied from CRM #[id number]. Topic: Clinical - Red Word Triage >> Jul 12, 2024  1:23 PM Winona R wrote: Heart palpitations, left hand numbness going into his feet on the same side. Went to ER Monday and this morning at 12 midnight- 5am this via ambulance. Hospital states all test they did was normal. Instructed pt to return to pcp. Blood pressure was almost at 200/xxx, Pt would like to know if he can see NP Pasada on Monday as he is currently in [facility]

## 2024-07-15 ENCOUNTER — Ambulatory Visit: Payer: Self-pay

## 2024-07-15 ENCOUNTER — Other Ambulatory Visit: Payer: Self-pay

## 2024-07-16 ENCOUNTER — Other Ambulatory Visit: Payer: Self-pay

## 2024-07-23 ENCOUNTER — Encounter: Payer: Self-pay | Admitting: Neurology

## 2024-07-23 ENCOUNTER — Ambulatory Visit (INDEPENDENT_AMBULATORY_CARE_PROVIDER_SITE_OTHER): Payer: Self-pay | Admitting: Nurse Practitioner

## 2024-07-23 ENCOUNTER — Encounter: Payer: Self-pay | Admitting: Nurse Practitioner

## 2024-07-23 ENCOUNTER — Ambulatory Visit: Admission: RE | Admit: 2024-07-23 | Payer: Self-pay | Source: Ambulatory Visit

## 2024-07-23 ENCOUNTER — Other Ambulatory Visit: Payer: Self-pay

## 2024-07-23 VITALS — BP 144/88 | HR 74 | Ht 73.0 in | Wt 189.0 lb

## 2024-07-23 DIAGNOSIS — R2689 Other abnormalities of gait and mobility: Secondary | ICD-10-CM | POA: Insufficient documentation

## 2024-07-23 DIAGNOSIS — Z125 Encounter for screening for malignant neoplasm of prostate: Secondary | ICD-10-CM

## 2024-07-23 DIAGNOSIS — R7303 Prediabetes: Secondary | ICD-10-CM

## 2024-07-23 DIAGNOSIS — H538 Other visual disturbances: Secondary | ICD-10-CM | POA: Insufficient documentation

## 2024-07-23 DIAGNOSIS — E785 Hyperlipidemia, unspecified: Secondary | ICD-10-CM

## 2024-07-23 DIAGNOSIS — H547 Unspecified visual loss: Secondary | ICD-10-CM | POA: Insufficient documentation

## 2024-07-23 DIAGNOSIS — R519 Headache, unspecified: Secondary | ICD-10-CM

## 2024-07-23 DIAGNOSIS — R200 Anesthesia of skin: Secondary | ICD-10-CM

## 2024-07-23 DIAGNOSIS — I10 Essential (primary) hypertension: Secondary | ICD-10-CM

## 2024-07-23 DIAGNOSIS — R42 Dizziness and giddiness: Secondary | ICD-10-CM

## 2024-07-23 DIAGNOSIS — Z0000 Encounter for general adult medical examination without abnormal findings: Secondary | ICD-10-CM

## 2024-07-23 MED ORDER — PROPRANOLOL HCL 20 MG PO TABS
20.0000 mg | ORAL_TABLET | Freq: Two times a day (BID) | ORAL | 3 refills | Status: DC
  Filled 2024-07-23: qty 60, 30d supply, fill #0
  Filled 2024-08-19 – 2024-08-21 (×2): qty 60, 30d supply, fill #1

## 2024-07-23 NOTE — Assessment & Plan Note (Signed)
 Routine wellness visit focused on lifestyle modifications. - Encourage heart-healthy, low-salt, low-fat diet. - Encourage regular exercise, 30 minutes of moderate to vigorous exercise 5 days a week. - Encourage 7-8 hours of sleep nightly. - Encourage stress management techniques.

## 2024-07-23 NOTE — Assessment & Plan Note (Signed)
 Lab Results  Component Value Date   HGBA1C 5.7 (H) 07/01/2024   - Encourage low-carb diet

## 2024-07-23 NOTE — Patient Instructions (Addendum)
 Please consider getting influenza, Shingrix , pneumococcal and Tdap vaccine at local pharmacy.   1. Dyslipidemia (Primary)  - Lipid panel  2. Numbness of left hand  - Iron, TIBC and Ferritin Panel - Vitamin B12   3. Screening for prostate cancer  - PSA  4. Primary hypertension - Ambulatory referral to Neurology - propranolol (INDERAL) 20 MG tablet; Take 1 tablet (20 mg total) by mouth 2 (two) times daily.  Dispense: 60 tablet; Refill: 3 - TSH - CBC - Basic Metabolic Panel - EKG 12-Lead  5. Acute nonintractable headache, unspecified headache type  - propranolol (INDERAL) 20 MG tablet; Take 1 tablet (20 mg total) by mouth 2 (two) times daily.  Dispense: 60 tablet; Refill: 3 - Ambulatory referral to Neurology  6. Loss of balance  - Ambulatory referral to Neurology     Around 3 times per week, check your blood pressure 2 times per day. once in the morning and once in the evening. The readings should be at least one minute apart. Write down these values and bring them to your next nurse visit/appointment.  When you check your BP, make sure you have been doing something calm/relaxing 5 minutes prior to checking. Both feet should be flat on the floor and you should be sitting. Use your left arm and make sure it is in a relaxed position (on a table), and that the cuff is at the approximate level/height of your heart. Blood pressure goal is less than 130/80  Surveyor, mining (accepts Medicaid, [employer], [employer], and/or Self-Pay) [facility]  [address] [place],  [place], [place] [address] [phone]  [facility]  [address] [place], [place] [address] [phone]  [facility]  [facility] [place] [address] [place], [place] [address] *Located next to [facility]  [phone]  [facility]  [address] [place], [place] [address]  *Located next to  [employer] [phone]  [facility]  [address] [place], [place] [address] [phone]  [facility]  [address] [place], [place] [address]  [phone] *Located inside [facility]  [address] [place], [place] [address] [phone]  [facility] [address] [place], [place] [address] [phone] [address] [place], [place] [address] [phone]  [facility] [address] [place], [place] [address] [phone]  [facility]  [address] [place], [place] [address] [phone]  [facility]  [address] [place], [place] [address] [phone]     It is important that you exercise regularly at least 30 minutes 5 times a week as tolerated  Think about what you will eat, plan ahead. Choose  clean, green, fresh or frozen over canned, processed or packaged foods which are more sugary, salty and fatty. 70 to 75% of food eaten should be vegetables and fruit. Three meals at set times with snacks allowed between meals, but they must be fruit or vegetables. Aim to eat over a 12 hour period , example 7 am to 7 pm, and STOP after  your last meal of the day. Drink water,generally about 64 ounces per day, no other drink is as healthy. Fruit juice is best enjoyed in a healthy way, by EATING the fruit.  Thanks for choosing [facility] we consider it a privelige to serve you.

## 2024-07-23 NOTE — Assessment & Plan Note (Addendum)
 Vision Screening   Right eye Left eye Both eyes  Without correction 20/20 20/20 20/20   With correction       Discussed need for regular eye examinations due to occasional blurry vision. - Refer to an eye doctor for vision assessment

## 2024-07-23 NOTE — Assessment & Plan Note (Addendum)
 BP Readings from Last 3 Encounters:  07/23/24 (!) 144/88  07/01/24 (!) 146/99  01/23/24 124/78  EKG shows normal sinus bradycardia rate of 68 beats beats per minute Blood pressure elevated at 144/88 mmHg despite amlodipine  and losartan . Propranolol added for dual benefit of blood pressure control and headache management. - Continue amlodipine  10 mg daily. - Continue losartan  50 mg daily. - Add propranolol 20 mg twice daily for blood pressure control and headache management.

## 2024-07-23 NOTE — Progress Notes (Addendum)
 Complete physical exam  Patient: Erik Choi   DOB: 22-Dec-1965   [name]  MRN: [id number]  Subjective:    Chief Complaint  Patient presents with   Annual Exam   Medical Management of Chronic Issues    Ongoing Numbness in left hand, speech problems, fatigue     Discussed the use of AI scribe software for clinical note transcription with the patient, who gave verbal consent to proceed.  History of Present Illness Erik Choi is a 58 year old male  has a past medical history of Cancer (HCC) (2019), Erectile dysfunction, and Hypertension (2019).  who presents for an annual physical exam.  He has been experiencing elevated blood pressure, consistently around 144 mmHg, despite taking amlodipine  10 mg daily and an increased dose of losartan  from 25 mg to 50 mg daily. He has a history of prediabetes with an A1c of 5.7 and a slightly low hemoglobin level of 12.6 g/dL. He has not had his B12 levels checked.  He has experienced numbness in his left arm and persistent headaches since his last visit. The headaches occur every night and are only temporarily relieved by Tylenol , which he has stopped taking. He visited the emergency room twice in [place] , where he reports that imaging was performed and he was told that everything was fine, records requested today . He has not been diagnosed with migraines but recalls having headaches in the past when he was younger.  He reports a dry tongue and occasional dizziness, especially when standing up or moving quickly. He attributes significant stress to being the primary breadwinner and managing business responsibilities. Despite getting 8 to 10 hours of sleep, he feels his energy levels have decreased. He has not been able to maintain a regular exercise routine due to his focus on work and business. He reports a recent improvement in appetite, now eating three to four meals a day, which he attributes to feeling better. He also mentions occasional  blurry vision and has not seen an eye doctor in about five years.  No abdominal pain, nausea, or vomiting.  Assessment & Plan .       Most recent fall risk assessment:    03/13/2023    2:37 PM  Fall Risk   Falls in the past year? 0  Number falls in past yr: 0  Injury with Fall? 0  Risk for fall due to : No Fall Risks  Follow up Falls evaluation completed     Most recent depression screenings:    07/23/2024   11:25 AM 07/01/2024    3:55 PM  PHQ 2/9 Scores  PHQ - 2 Score 2 0  PHQ- 9 Score 5 0        Patient Care Team: Ivorie Uplinger R, FNP as PCP - General (Nurse Practitioner)   Outpatient Medications Prior to Visit  Medication Sig   Acetaminophen  (TYLENOL  PO) Take by mouth.   amLODipine  (NORVASC ) 10 MG tablet Take 1 tablet (10 mg total) by mouth daily. (Must have office visit for refills)   CYANOCOBALAMIN PO Take by mouth.   cyclobenzaprine  (FLEXERIL ) 10 MG tablet Take 1 tablet (10 mg total) by mouth 3 (three) times daily as needed for muscle spasms.   Hypromellose (VISTA GONIO DRY EYE RELIEF) 2.5 % SOLN Place 1 drop into both eyes daily.   losartan  (COZAAR ) 50 MG tablet Take 1 tablet (50 mg total) by mouth daily.   meloxicam  (MOBIC ) 7.5 MG tablet Take 1 tablet (7.5 mg total) by  mouth daily as needed for pain.   Multiple Vitamin (MULTIVITAMIN WITH MINERALS) TABS tablet Take 1 tablet by mouth daily.   sildenafil  (VIAGRA ) 100 MG tablet Take 0.5-1 tablets (50-100 mg total) by mouth daily as needed for erectile dysfunction.   No facility-administered medications prior to visit.    Review of Systems  Constitutional:  Positive for fatigue. Negative for appetite change, chills and fever.  HENT:  Negative for congestion, postnasal drip, rhinorrhea and sneezing.   Eyes:  Positive for visual disturbance. Negative for pain, discharge and itching.  Respiratory:  Negative for cough, shortness of breath and wheezing.   Cardiovascular:  Negative for chest pain,  palpitations and leg swelling.  Gastrointestinal:  Negative for abdominal pain, constipation, nausea and vomiting.  Genitourinary:  Negative for difficulty urinating, dysuria, flank pain and frequency.  Musculoskeletal:  Negative for arthralgias, back pain, joint swelling and myalgias.  Skin:  Negative for color change, pallor, rash and wound.  Allergic/Immunologic: Negative for food allergies and immunocompromised state.  Neurological:  Positive for speech difficulty, numbness and headaches. Negative for facial asymmetry and weakness.  Psychiatric/Behavioral:  Negative for behavioral problems, confusion, self-injury and suicidal ideas.        Objective:     BP (!) 144/88 (BP Location: Right Arm, Patient Position: Sitting, Cuff Size: Normal)   Pulse 74   Ht 6' 1 (1.854 m)   Wt 189 lb (85.7 kg)   BMI 24.94 kg/m    Physical Exam Vitals and nursing note reviewed.  Constitutional:      General: He is not in acute distress.    Appearance: Normal appearance. He is not ill-appearing, toxic-appearing or diaphoretic.  HENT:     Right Ear: Tympanic membrane, ear canal and external ear normal. There is no impacted cerumen.     Left Ear: Tympanic membrane, ear canal and external ear normal. There is no impacted cerumen.     Nose: Nose normal. No congestion or rhinorrhea.     Mouth/Throat:     Mouth: Mucous membranes are moist.     Pharynx: Oropharynx is clear. No oropharyngeal exudate or posterior oropharyngeal erythema.  Eyes:     General: No scleral icterus.       Right eye: No discharge.        Left eye: No discharge.     Extraocular Movements: Extraocular movements intact.     Conjunctiva/sclera: Conjunctivae normal.  Neck:     Vascular: No carotid bruit.  Cardiovascular:     Rate and Rhythm: Normal rate and regular rhythm.     Pulses: Normal pulses.     Heart sounds: Normal heart sounds. No murmur heard.    No friction rub. No gallop.  Pulmonary:     Effort: Pulmonary effort  is normal. No respiratory distress.     Breath sounds: Normal breath sounds. No stridor. No wheezing, rhonchi or rales.  Chest:     Chest wall: No tenderness.  Abdominal:     General: Bowel sounds are normal. There is no distension.     Palpations: Abdomen is soft. There is no mass.     Tenderness: There is no abdominal tenderness. There is no right CVA tenderness, left CVA tenderness, guarding or rebound.     Hernia: No hernia is present.  Musculoskeletal:        General: No swelling, tenderness, deformity or signs of injury.     Cervical back: Normal range of motion and neck supple. No rigidity or tenderness.  Right lower leg: No edema.     Left lower leg: No edema.  Lymphadenopathy:     Cervical: No cervical adenopathy.  Skin:    General: Skin is warm and dry.     Capillary Refill: Capillary refill takes less than 2 seconds.     Coloration: Skin is not jaundiced or pale.     Findings: No bruising, erythema, lesion or rash.     Comments: A soft mobile mass noted on left lateral chest  Neurological:     Mental Status: He is alert and oriented to person, place, and time.     Cranial Nerves: No cranial nerve deficit.     Motor: No weakness.     Gait: Gait normal.     Comments: Normal speech  Psychiatric:        Mood and Affect: Mood normal.        Behavior: Behavior normal.        Thought Content: Thought content normal.        Judgment: Judgment normal.     No results found for any visits on 07/23/24.     Assessment & Plan:    Routine Health Maintenance and Physical Exam   There is no immunization history on file for this patient.  Health Maintenance  Topic Date Due   Hepatitis B Vaccines 19-59 Average Risk (1 of 3 - 19+ 3-dose series) Never done   Pneumococcal Vaccine: 50+ Years (1 of 1 - PCV) Never done   Zoster Vaccines- Shingrix (1 of 2) Never done   Influenza Vaccine  Never done   COVID-19 Vaccine (1 - 2025-26 season) Never done   DTaP/Tdap/Td (1 - Tdap)  09/18/2024 (Originally 02/17/1985)   Hepatitis C Screening  Completed   HIV Screening  Completed   HPV VACCINES  Aged Out   Meningococcal B Vaccine  Aged Out   Colonoscopy  Discontinued    Discussed health benefits of physical activity, and encouraged him to engage in regular exercise appropriate for his age and condition.  Problem List Items Addressed This Visit       Cardiovascular and Mediastinum   HTN (hypertension)   BP Readings from Last 3 Encounters:  07/23/24 (!) 144/88  07/01/24 (!) 146/99  01/23/24 124/78  EKG shows normal sinus bradycardia rate of 68 beats beats per minute Blood pressure elevated at 144/88 mmHg despite amlodipine  and losartan . Propranolol added for dual benefit of blood pressure control and headache management. - Continue amlodipine  10 mg daily. - Continue losartan  50 mg daily. - Add propranolol 20 mg twice daily for blood pressure control and headache management.       Relevant Medications   propranolol (INDERAL) 20 MG tablet   Other Relevant Orders   TSH   CBC   Basic Metabolic Panel   EKG 12-Lead     Other   Annual physical exam - Primary   Routine wellness visit focused on lifestyle modifications. - Encourage heart-healthy, low-salt, low-fat diet. - Encourage regular exercise, 30 minutes of moderate to vigorous exercise 5 days a week. - Encourage 7-8 hours of sleep nightly. - Encourage stress management techniques.       Numbness of left hand   Numbness of left hand Mild anemia with hemoglobin level of 12.6 g/dL noted Numbness possibly related to low iron or carpal tunnel syndrome. - Check B12 and iron levels. Refer to neurology       Relevant Orders   Iron, TIBC and Ferritin Panel   Vitamin  B12   Ambulatory referral to Neurology   Headache   Acute headache with associated symptoms  headache with numbness, dry mouth, and dizziness. Stroke ruled out. Differential includes migraine, stress-related headache, or other neurological  causes. - Refer to neurology for further evaluation. - Add propranolol for headache management.       Relevant Medications   Acetaminophen  (TYLENOL  PO)   propranolol (INDERAL) 20 MG tablet   Other Relevant Orders   Ambulatory referral to Neurology   Prediabetes   Lab Results  Component Value Date   HGBA1C 5.7 (H) 07/01/2024   - Encourage low-carb diet       Dizziness   Relevant Orders   Ambulatory referral to Neurology   Impaired vision   Blurry vision   Vision Screening   Right eye Left eye Both eyes  Without correction 20/20 20/20 20/20   With correction       Discussed need for regular eye examinations due to occasional blurry vision. - Refer to an eye doctor for vision assessment      Other Visit Diagnoses       Dyslipidemia       Relevant Orders   Lipid panel     Screening for prostate cancer       Relevant Orders   PSA      Return in about 4 weeks (around 08/20/2024) for HTN, headache.     Erik Poinsett R Cicely Ortner, FNP

## 2024-07-23 NOTE — Assessment & Plan Note (Signed)
 Acute headache with associated symptoms  headache with numbness, dry mouth, and dizziness. Stroke ruled out. Differential includes migraine, stress-related headache, or other neurological causes. - Refer to neurology for further evaluation. - Add propranolol for headache management.

## 2024-07-23 NOTE — Assessment & Plan Note (Addendum)
 Numbness of left hand Mild anemia with hemoglobin level of 12.6 g/dL noted Numbness possibly related to low iron or carpal tunnel syndrome. - Check B12 and iron levels. Refer to neurology

## 2024-07-26 ENCOUNTER — Ambulatory Visit: Payer: Self-pay | Admitting: Nurse Practitioner

## 2024-07-27 LAB — LIPID PANEL
Chol/HDL Ratio: 3.9 ratio (ref 0.0–5.0)
Cholesterol, Total: 165 mg/dL (ref 100–199)
HDL: 42 mg/dL (ref >39)
LDL Chol Calc (NIH): 108 mg/dL — ABNORMAL HIGH (ref 0–99)
Triglycerides: 79 mg/dL (ref 0–149)
VLDL Cholesterol Cal: 15 mg/dL (ref 5–40)

## 2024-07-27 LAB — VITAMIN B12: Vitamin B-12: >2000 pg/mL — AB (ref 232–1245)

## 2024-07-27 LAB — CBC
Hematocrit: 42 % (ref 37.5–51.0)
Hemoglobin: 13.2 g/dL (ref 13.0–17.7)
MCH: 28.6 pg (ref 26.6–33.0)
MCHC: 31.4 g/dL — ABNORMAL LOW (ref 31.5–35.7)
MCV: 91 fL (ref 79–97)
Platelets: 284 x10E3/uL (ref 150–450)
RBC: 4.61 x10E6/uL (ref 4.14–5.80)
RDW: 13.4 % (ref 11.6–15.4)
WBC: 4.6 x10E3/uL (ref 3.4–10.8)

## 2024-07-27 LAB — BASIC METABOLIC PANEL WITH GFR
BUN/Creatinine Ratio: 10 (ref 9–20)
BUN: 12 mg/dL (ref 6–24)
CO2: 24 mmol/L (ref 20–29)
Calcium: 10 mg/dL (ref 8.7–10.2)
Chloride: 101 mmol/L (ref 96–106)
Creatinine, Ser: 1.17 mg/dL (ref 0.76–1.27)
Glucose: 92 mg/dL (ref 70–99)
Potassium: 4.3 mmol/L (ref 3.5–5.2)
Sodium: 140 mmol/L (ref 134–144)
eGFR: 73 mL/min/1.73 (ref >59)

## 2024-07-27 LAB — PSA: Prostate Specific Ag, Serum: 1.3 ng/mL (ref 0.0–4.0)

## 2024-07-27 LAB — IRON,TIBC AND FERRITIN PANEL
Ferritin: 132 ng/mL (ref 30–400)
Iron Saturation: 34 % (ref 15–55)
Iron: 106 ug/dL (ref 38–169)
Total Iron Binding Capacity: 312 ug/dL (ref 250–450)
UIBC: 206 ug/dL (ref 111–343)

## 2024-07-27 LAB — TSH: TSH: 1.19 u[IU]/mL (ref 0.450–4.500)

## 2024-07-30 ENCOUNTER — Ambulatory Visit: Payer: Self-pay | Admitting: Nurse Practitioner

## 2024-08-05 ENCOUNTER — Other Ambulatory Visit: Payer: Self-pay

## 2024-08-19 ENCOUNTER — Other Ambulatory Visit: Payer: Self-pay

## 2024-08-20 ENCOUNTER — Ambulatory Visit: Payer: Self-pay | Admitting: Nurse Practitioner

## 2024-08-21 ENCOUNTER — Ambulatory Visit: Payer: Self-pay | Admitting: Urology

## 2024-08-21 ENCOUNTER — Other Ambulatory Visit: Payer: Self-pay

## 2024-08-21 DIAGNOSIS — N2889 Other specified disorders of kidney and ureter: Secondary | ICD-10-CM | POA: Insufficient documentation

## 2024-08-21 NOTE — Assessment & Plan Note (Deleted)
~  2.5cm Right interpolar renal mass (dx 2019)  - on AS  - RBUS 2024 - stable size/character  Reviewed his clinical history and prior imaging, including initial MRI in 2019 and subsequent ultrasounds, last in November 2024.  Recommend interval imaging-I think it would be appropriate to repeat a contrasted cross-sectional scan, favor dedicated CT.   - CT Renal mass protocol, follow up once complete

## 2024-08-21 NOTE — Progress Notes (Deleted)
   08/23/2024 9:25 AM   Erik Choi 1965/10/23 [id number]  Reason for visit: Follow up Right renal mass   HPI: 58 y.o. male, initial follow up with me today, previously seen by Dr. 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   Constitutional:  Alert and oriented, No acute distress.  Laboratory Data:  Latest Reference Range & Units 03/20/23 09:39 09/19/23 09:04 01/23/24 10:13 07/01/24 15:53 07/23/24 12:03  Creatinine 0.76 - 1.27 mg/dL 8.92 8.77 8.55 (H) 8.79 1.17    Pertinent Imaging: I have personally viewed and interpreted the RBUS (Nov 2024) - .    Assessment & Plan:    Renal mass Assessment & Plan: ~2.5cm Right interpolar renal mass (dx 2019)  - on AS  - RBUS 2024 - stable size/character  Reviewed his clinical history and prior imaging, including initial MRI in 2019 and subsequent ultrasounds, last in November 2024.  Recommend interval imaging-I think it would be appropriate to repeat a contrasted cross-sectional scan, favor dedicated CT.   - CT Renal mass protocol, follow up once complete        Penne JONELLE Skye, MD  [facility] [address] [place], [place] [address] [phone]

## 2024-08-23 ENCOUNTER — Ambulatory Visit: Payer: Self-pay | Admitting: Urology

## 2024-09-10 ENCOUNTER — Ambulatory Visit: Payer: Self-pay | Admitting: Nurse Practitioner

## 2024-09-10 ENCOUNTER — Other Ambulatory Visit: Payer: Self-pay

## 2024-09-10 ENCOUNTER — Encounter: Payer: Self-pay | Admitting: Nurse Practitioner

## 2024-09-10 VITALS — BP 129/88 | HR 70 | Wt 193.4 lb

## 2024-09-10 DIAGNOSIS — R519 Headache, unspecified: Secondary | ICD-10-CM

## 2024-09-10 DIAGNOSIS — N2889 Other specified disorders of kidney and ureter: Secondary | ICD-10-CM

## 2024-09-10 DIAGNOSIS — I10 Essential (primary) hypertension: Secondary | ICD-10-CM

## 2024-09-10 MED ORDER — LOSARTAN POTASSIUM 50 MG PO TABS
50.0000 mg | ORAL_TABLET | Freq: Every day | ORAL | 1 refills | Status: AC
  Filled 2024-09-10 – 2024-10-10 (×2): qty 90, 90d supply, fill #0

## 2024-09-10 MED ORDER — PROPRANOLOL HCL 40 MG PO TABS
40.0000 mg | ORAL_TABLET | Freq: Two times a day (BID) | ORAL | 2 refills | Status: DC
  Filled 2024-09-10 – 2024-09-23 (×2): qty 120, 60d supply, fill #0

## 2024-09-10 NOTE — Progress Notes (Signed)
 Established Patient Office Visit  Subjective:  Patient ID: Erik Choi, male    DOB: [birth year]  Age: 58 y.o. MRN: [id number]  CC:  Chief Complaint  Patient presents with   Hypertension   Headache    Headaches are not as strong as before but are still present, intermittent    HPI   Discussed the use of AI scribe software for clinical note transcription with the patient, who gave verbal consent to proceed.  History of Present Illness Erik Choi is a 58 year old male   has a past medical history of Cancer (HCC) (2019), Erectile dysfunction, and Hypertension (2019).  who presents for blood pressure management and headaches  His blood pressure readings at home have been consistent with today's reading of 129/88 mmHg. He experienced a spike to 180 mmHg on Thanksgiving Day after consuming a high-salt diet, including popcorn and fried chicken, and spending the night in his truck. He sought a second opinion at a local hospital, where he was advised to drink plenty of water and follow a low-salt diet.  He is currently taking amlodipine  10 mg daily, losartan  50 mg daily, and propranolol  20 mg twice daily. Propranolol  has helped with his headaches, which now occur less frequently and with less intensity. He describes the headaches as 'slight' and 'come and go'.  He is trying to manage stress and follow a heart-healthy, low-salt diet. He reports that he is trying to get enough rest and reduce stress in his life. He exercises every night and morning.  He has an upcoming appointment with a neurologist on December 29th and is scheduled for a kidney ultrasound next Monday to monitor a previously identified issue.   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   Erectile dysfunction    Hypertension 2019    Past Surgical History:  Procedure Laterality Date   HAND SURGERY Left    58 yo    Family History  Problem Relation Age of Onset    Diabetes Father    Diabetes Sister    Cancer Neg Hx    Stroke Neg Hx    Heart attack Neg Hx     Social History   Socioeconomic History   Marital status: Single    Spouse name: Not on file   Number of children: 2   Years of education: Not on file   Highest education level: Not on file  Occupational History   Not on file  Tobacco Use   Smoking status: Never   Smokeless tobacco: Never  Vaping Use   Vaping status: Never Used  Substance and Sexual Activity   Alcohol use: Never   Drug use: Never   Sexual activity: Yes  Other Topics Concern   Not on file  Social History Narrative   Lives with his girlfriend.   Social Drivers of Corporate Investment Banker Strain: Not on file  Food Insecurity: Not on file  Transportation Needs: Not on file  Physical Activity: Not on file  Stress: Not on file  Social Connections: Not on file  Intimate Partner Violence: Not At Risk (07/08/2024)   Received from [facility]    Abuse Screen    Feels Unsafe at Home or Work/School: no    Feels Threatened by Someone: no    Does Anyone Try to Keep You From Having Contact with Others or Doing Things Outside Your Home?: no  Physical Signs of Abuse Present: no    Outpatient Medications Prior to Visit  Medication Sig Dispense Refill   Acetaminophen  (TYLENOL  PO) Take by mouth.     amLODipine  (NORVASC ) 10 MG tablet Take 1 tablet (10 mg total) by mouth daily. (Must have office visit for refills) 90 tablet 2   CYANOCOBALAMIN PO Take by mouth.     cyclobenzaprine  (FLEXERIL ) 10 MG tablet Take 1 tablet (10 mg total) by mouth 3 (three) times daily as needed for muscle spasms. 60 tablet 0   Hypromellose ([place] DRY EYE RELIEF) 2.5 % SOLN Place 1 drop into both eyes daily. 15 mL 0   meloxicam  (MOBIC ) 7.5 MG tablet Take 1 tablet (7.5 mg total) by mouth daily as needed for pain. 30 tablet 0   Multiple Vitamin (MULTIVITAMIN WITH MINERALS) TABS tablet Take 1 tablet by mouth daily.  30 tablet 6   sildenafil  (VIAGRA ) 100 MG tablet Take 0.5-1 tablets (50-100 mg total) by mouth daily as needed for erectile dysfunction. 10 tablet 3   losartan  (COZAAR ) 50 MG tablet Take 1 tablet (50 mg total) by mouth daily. 90 tablet 1   propranolol  (INDERAL ) 20 MG tablet Take 1 tablet (20 mg total) by mouth 2 (two) times daily. 60 tablet 3   No facility-administered medications prior to visit.    Allergies  Allergen Reactions   Lisinopril     Hctz [Hydrochlorothiazide ] Other (See Comments)    Headache     ROS Review of Systems  Constitutional:  Negative for appetite change, chills, fatigue and fever.  HENT:  Negative for congestion, postnasal drip, rhinorrhea and sneezing.   Respiratory:  Negative for cough, shortness of breath and wheezing.   Cardiovascular:  Negative for chest pain, palpitations and leg swelling.  Gastrointestinal:  Negative for abdominal pain, constipation, nausea and vomiting.  Genitourinary:  Negative for difficulty urinating, dysuria, flank pain and frequency.  Musculoskeletal:  Negative for arthralgias, back pain, joint swelling and myalgias.  Skin:  Negative for color change, pallor, rash and wound.  Neurological:  Negative for dizziness, facial asymmetry, weakness, numbness and headaches.  Psychiatric/Behavioral:  Negative for behavioral problems, confusion, self-injury and suicidal ideas.       Objective:    Physical Exam Vitals and nursing note reviewed.  Constitutional:      General: He is not in acute distress.    Appearance: Normal appearance. He is not ill-appearing, toxic-appearing or diaphoretic.  Eyes:     General: No scleral icterus.       Right eye: No discharge.        Left eye: No discharge.     Extraocular Movements: Extraocular movements intact.     Conjunctiva/sclera: Conjunctivae normal.  Cardiovascular:     Rate and Rhythm: Normal rate and regular rhythm.     Pulses: Normal pulses.     Heart sounds: Normal heart sounds. No  murmur heard.    No friction rub. No gallop.  Pulmonary:     Effort: Pulmonary effort is normal. No respiratory distress.     Breath sounds: Normal breath sounds. No stridor. No wheezing, rhonchi or rales.  Chest:     Chest wall: No tenderness.  Abdominal:     General: There is no distension.     Palpations: Abdomen is soft.     Tenderness: There is no abdominal tenderness. There is no right CVA tenderness, left CVA tenderness or guarding.  Musculoskeletal:        General: No swelling, tenderness, deformity or  signs of injury.     Right lower leg: No edema.     Left lower leg: No edema.  Skin:    General: Skin is warm and dry.     Capillary Refill: Capillary refill takes less than 2 seconds.     Coloration: Skin is not jaundiced or pale.     Findings: No bruising, erythema or lesion.  Neurological:     Mental Status: He is alert and oriented to person, place, and time.     Motor: No weakness.     Coordination: Coordination normal.     Gait: Gait normal.  Psychiatric:        Mood and Affect: Mood normal.        Behavior: Behavior normal.        Thought Content: Thought content normal.        Judgment: Judgment normal.     BP 129/88   Pulse 70   Wt 193 lb 6.4 oz (87.7 kg)   SpO2 100%   BMI 25.52 kg/m  Wt Readings from Last 3 Encounters:  09/10/24 193 lb 6.4 oz (87.7 kg)  07/23/24 189 lb (85.7 kg)  07/01/24 201 lb (91.2 kg)    Lab Results  Component Value Date   TSH 1.190 07/23/2024   Lab Results  Component Value Date   WBC 4.6 07/23/2024   HGB 13.2 07/23/2024   HCT 42.0 07/23/2024   MCV 91 07/23/2024   PLT 284 07/23/2024   Lab Results  Component Value Date   NA 140 07/23/2024   K 4.3 07/23/2024   CO2 24 07/23/2024   GLUCOSE 92 07/23/2024   BUN 12 07/23/2024   CREATININE 1.17 07/23/2024   BILITOT 0.3 07/01/2024   ALKPHOS 108 07/01/2024   AST 19 07/01/2024   ALT 18 07/01/2024   PROT 6.8 07/01/2024   ALBUMIN 4.4 07/01/2024   CALCIUM 10.0 07/23/2024    ANIONGAP 6 05/18/2021   EGFR 73 07/23/2024   Lab Results  Component Value Date   CHOL 165 07/23/2024   Lab Results  Component Value Date   HDL 42 07/23/2024   Lab Results  Component Value Date   LDLCALC 108 (H) 07/23/2024   Lab Results  Component Value Date   TRIG 79 07/23/2024   Lab Results  Component Value Date   CHOLHDL 3.9 07/23/2024   Lab Results  Component Value Date   HGBA1C 5.7 (H) 07/01/2024      Assessment & Plan:   Problem List Items Addressed This Visit       Cardiovascular and Mediastinum   HTN (hypertension) - Primary   BP Readings from Last 3 Encounters:  09/10/24 129/88  07/23/24 (!) 144/88  07/01/24 (!) 146/99   Recent dietary indiscretion caused temporary elevation, now normalized at 129/88 mmHg. - Continue amlodipine  10 mg daily. - Continue losartan  50 mg daily. - Increased propranolol  to 40 mg twice daily. - Advised on heart-healthy, low salt, low fat diet. - Encouraged regular exercise and stress management.       Relevant Medications   propranolol  (INDERAL ) 40 MG tablet   losartan  (COZAAR ) 50 MG tablet     Other   Headache   Improved with propranolol , now slight and intermittent. - Increased propranolol  to 40 mg twice daily. - Instructed to monitor for side effects such as dizziness or hypotension. - Advised to contact provider if heart rate is less than 55 bpm. Follow-up with neurology as planned       Relevant  Medications   propranolol  (INDERAL ) 40 MG tablet   Renal mass   Has upcoming appointment with nephrologist       Meds ordered this encounter  Medications   propranolol  (INDERAL ) 40 MG tablet    Sig: Take 1 tablet (40 mg total) by mouth 2 (two) times daily.    Dispense:  120 tablet    Refill:  2   losartan  (COZAAR ) 50 MG tablet    Sig: Take 1 tablet (50 mg total) by mouth daily.    Dispense:  90 tablet    Refill:  1    Follow-up: Return in about 2 months (around 11/11/2024) for HTN, headache.     Tiandra Swoveland R Daemon Dowty, FNP

## 2024-09-10 NOTE — Patient Instructions (Signed)
 1. Primary hypertension (Primary)  - propranolol  (INDERAL ) 40 MG tablet; Take 1 tablet (40 mg total) by mouth 2 (two) times daily.  Dispense: 120 tablet; Refill: 2 - losartan  (COZAAR ) 50 MG tablet; Take 1 tablet (50 mg total) by mouth daily.  Dispense: 90 tablet; Refill: 1  2. Acute nonintractable headache, unspecified headache type  - propranolol  (INDERAL ) 40 MG tablet; Take 1 tablet (40 mg total) by mouth 2 (two) times daily.  Dispense: 120 tablet; Refill: 2    It is important that you exercise regularly at least 30 minutes 5 times a week as tolerated  Think about what you will eat, plan ahead. Choose  clean, green, fresh or frozen over canned, processed or packaged foods which are more sugary, salty and fatty. 70 to 75% of food eaten should be vegetables and fruit. Three meals at set times with snacks allowed between meals, but they must be fruit or vegetables. Aim to eat over a 12 hour period , example 7 am to 7 pm, and STOP after  your last meal of the day. Drink water,generally about 64 ounces per day, no other drink is as healthy. Fruit juice is best enjoyed in a healthy way, by EATING the fruit.  Thanks for choosing [facility] we consider it a privelige to serve you.

## 2024-09-10 NOTE — Assessment & Plan Note (Signed)
 Has upcoming appointment with nephrologist

## 2024-09-10 NOTE — Assessment & Plan Note (Signed)
 BP Readings from Last 3 Encounters:  09/10/24 129/88  07/23/24 (!) 144/88  07/01/24 (!) 146/99   Recent dietary indiscretion caused temporary elevation, now normalized at 129/88 mmHg. - Continue amlodipine  10 mg daily. - Continue losartan  50 mg daily. - Increased propranolol  to 40 mg twice daily. - Advised on heart-healthy, low salt, low fat diet. - Encouraged regular exercise and stress management.

## 2024-09-10 NOTE — Assessment & Plan Note (Signed)
 Improved with propranolol , now slight and intermittent. - Increased propranolol  to 40 mg twice daily. - Instructed to monitor for side effects such as dizziness or hypotension. - Advised to contact provider if heart rate is less than 55 bpm. Follow-up with neurology as planned

## 2024-09-17 ENCOUNTER — Ambulatory Visit: Payer: Self-pay | Attending: Nurse Practitioner

## 2024-09-18 ENCOUNTER — Other Ambulatory Visit: Payer: Self-pay

## 2024-09-19 ENCOUNTER — Other Ambulatory Visit: Payer: Self-pay

## 2024-09-23 ENCOUNTER — Other Ambulatory Visit: Payer: Self-pay

## 2024-10-07 ENCOUNTER — Ambulatory Visit: Payer: Self-pay | Admitting: Neurology

## 2024-10-10 ENCOUNTER — Other Ambulatory Visit: Payer: Self-pay | Admitting: Nurse Practitioner

## 2024-10-10 DIAGNOSIS — M545 Low back pain, unspecified: Secondary | ICD-10-CM

## 2024-10-10 DIAGNOSIS — N529 Male erectile dysfunction, unspecified: Secondary | ICD-10-CM

## 2024-10-11 ENCOUNTER — Other Ambulatory Visit: Payer: Self-pay

## 2024-10-11 MED ORDER — CYCLOBENZAPRINE HCL 10 MG PO TABS
10.0000 mg | ORAL_TABLET | Freq: Three times a day (TID) | ORAL | 0 refills | Status: AC | PRN
  Filled 2024-10-11: qty 60, 20d supply, fill #0

## 2024-10-11 MED ORDER — SILDENAFIL CITRATE 100 MG PO TABS
50.0000 mg | ORAL_TABLET | Freq: Every day | ORAL | 3 refills | Status: AC | PRN
  Filled 2024-10-11: qty 10, 10d supply, fill #0

## 2024-10-11 MED ORDER — MELOXICAM 7.5 MG PO TABS
7.5000 mg | ORAL_TABLET | Freq: Every day | ORAL | 0 refills | Status: AC | PRN
  Filled 2024-10-11: qty 30, 30d supply, fill #0

## 2024-10-11 NOTE — Telephone Encounter (Signed)
 sildenafil  (VIAGRA ) 100 MG tablet      cyclobenzaprine  (FLEXERIL ) 10 MG tablet      meloxicam  (MOBIC ) 7.5 MG tablet

## 2024-10-15 ENCOUNTER — Other Ambulatory Visit: Payer: Self-pay

## 2024-10-21 ENCOUNTER — Ambulatory Visit: Payer: Self-pay

## 2024-10-21 NOTE — Telephone Encounter (Signed)
 FYI Only or Action Required?: FYI only for provider: ED advised.  Patient was last seen in primary care on 09/10/2024 by Paseda, Folashade R, FNP.  Called Nurse Triage reporting Neurologic Problem.  Symptoms began several days ago.  Interventions attempted: Nothing.  Symptoms are: unchanged.  Triage Disposition: Go to ED Now (Notify PCP)  Patient/caregiver understands and will follow disposition?: Yes   Copied from CRM [id number]. Topic: Clinical - Red Word Triage >> Oct 21, 2024  9:28 AM Erik Choi wrote: Red Word that prompted transfer to Nurse Triage: Numbness on right leg and toes . Painful when he walks, feels like needles . Reason for Disposition  [1] Numbness (i.e., loss of sensation) of the face, arm / hand, or leg / foot on one side of the body AND [2] sudden onset AND [3] brief (now gone)  Answer Assessment - Initial Assessment Questions 1. SYMPTOM: What is the main symptom you are concerned about? (e.g., weakness, numbness)      Numbness, tingling, needles of right leg and toes  2. ONSET: When did this start? (e.g., minutes, hours, days; while sleeping)     X 4 days  3. LAST NORMAL: When was the last time you (the patient) were normal (no symptoms)?     4 days ago  4. PATTERN Does this come and go, or has it been constant since it started?  Is it present now?      Comes and goes  5. CARDIAC SYMPTOMS: Have you had any of the following symptoms: chest pain, difficulty breathing, palpitations?      Hx of HTN  6. NEUROLOGIC SYMPTOMS: Pt denies headache, dizziness, vision loss, double vision, changes in speech, unsteady on your feet       7. OTHER SYMPTOMS: Do you have any other symptoms?     Feeling ice/cold on toes BP 116/70 HR 63   Pt reports numbness and tingling of right leg and toes Pt scheduled for a visit on   for further evaluation. Pt agrees with plan of care, will call back for any worsening symptoms  Protocols used: Neurologic  Deficit-A-AH

## 2024-10-21 NOTE — Telephone Encounter (Signed)
 1st CB attempt. No answer, LVM to call office back.   Erik Choi   10/21/2024  4:45 PM  was NT earlier today and advised to go to ED. He is still sitting in the lobby and is stating he just wants to see PCP. Disconnected while I was holding for NT, no answer on call back. Please try to reach out! TY

## 2024-10-22 ENCOUNTER — Telehealth: Payer: Self-pay

## 2024-10-22 NOTE — Telephone Encounter (Signed)
 Appt was made . Pt will be sen 10/23/2024. kh

## 2024-10-22 NOTE — Transitions of Care (Post Inpatient/ED Visit) (Signed)
" ° °  10/22/2024  Name: Erik Choi MRN: [id number] DOB: 04/21/1966  Today's TOC FU Call Status: Today's TOC FU Call Status:: Successful TOC FU Call Completed TOC FU Call Complete Date: 10/22/24  Patient's Name and Date of Birth confirmed. Name, DOB  Transition Care Management Follow-up Telephone Call Date of Discharge: 10/21/24 Discharge Facility: Other [facility]) Name of Other ([facility]) Discharge Facility: [facility]  Type of Discharge: Emergency Department Reason for ED Visit: Cardiac Conditions, Other: Cardiac Conditions Diagnosis:  (Blood Pressure) How have you been since you were released from the hospital?: Better Any questions or concerns?: No  Items Reviewed: Did you receive and understand the discharge instructions provided?: No (unknown) Medications obtained,verified, and reconciled?: Yes (Medications Reviewed) Any new allergies since your discharge?: No Dietary orders reviewed?: No Do you have support at home?: Yes People in Home [RPT]: other relative(s)  Medications Reviewed Today: Medications Reviewed Today   Medications were not reviewed in this encounter     Home Care and Equipment/Supplies: Were Home Health Services Ordered?: No  Functional Questionnaire: Do you need assistance with bathing/showering or dressing?: No Do you need assistance with meal preparation?: No Do you need assistance with eating?: No Do you have difficulty maintaining continence: No Do you need assistance with getting out of bed/getting out of a chair/moving?: No Do you have difficulty managing or taking your medications?: No  Follow up appointments reviewed: [facility] Follow-up appointment confirmed?: No Do you need transportation to your follow-up appointment?: No  Pt decline appt. Will be seen 11/12/24 at 3 pm.   SIGNATURE: Leondro Coryell P Cole, CMA "

## 2024-10-23 ENCOUNTER — Inpatient Hospital Stay: Payer: Self-pay | Admitting: Nurse Practitioner

## 2024-11-12 ENCOUNTER — Other Ambulatory Visit: Payer: Self-pay

## 2024-11-12 ENCOUNTER — Ambulatory Visit: Payer: Self-pay | Admitting: Nurse Practitioner

## 2024-11-12 ENCOUNTER — Encounter: Payer: Self-pay | Admitting: Nurse Practitioner

## 2024-11-12 VITALS — BP 125/84 | HR 59 | Temp 97.4°F | Wt 196.4 lb

## 2024-11-12 DIAGNOSIS — R7989 Other specified abnormal findings of blood chemistry: Secondary | ICD-10-CM | POA: Insufficient documentation

## 2024-11-12 DIAGNOSIS — R519 Headache, unspecified: Secondary | ICD-10-CM

## 2024-11-12 DIAGNOSIS — R7303 Prediabetes: Secondary | ICD-10-CM

## 2024-11-12 DIAGNOSIS — I10 Essential (primary) hypertension: Secondary | ICD-10-CM

## 2024-11-12 MED ORDER — PROPRANOLOL HCL 20 MG PO TABS
20.0000 mg | ORAL_TABLET | Freq: Two times a day (BID) | ORAL | 1 refills | Status: AC
  Filled 2024-11-12: qty 180, 90d supply, fill #0

## 2024-11-12 NOTE — Assessment & Plan Note (Signed)
 BP Readings from Last 3 Encounters:  11/12/24 125/84  09/10/24 129/88  07/23/24 (!) 144/88   Hypertension Blood pressure controlled with current regimen. Propranolol  efficacy affected by incorrect dosing. - Continue amlodipine  10 mg oral daily. - Continue losartan  50 mg oral daily. - Adjusted propranolol  to 20 mg oral twice daily. - Educated on importance of twice daily propranolol  for blood pressure and HA control. Discussed DASH diet and dietary sodium restrictions Continue to increase dietary efforts and exercise.

## 2024-11-12 NOTE — Patient Instructions (Signed)
 1. Primary hypertension (Primary) - propranolol  (INDERAL ) 20 MG tablet; Take 1 tablet (20 mg total) by mouth 2 (two) times daily.  Dispense: 180 tablet; Refill: 1  2. Acute nonintractable headache, unspecified headache type - propranolol  (INDERAL ) 20 MG tablet; Take 1 tablet (20 mg total) by mouth 2 (two) times daily.  Dispense: 180 tablet; Refill: 1    It is important that you exercise regularly at least 30 minutes 5 times a week as tolerated  Think about what you will eat, plan ahead. Choose  clean, green, fresh or frozen over canned, processed or packaged foods which are more sugary, salty and fatty. 70 to 75% of food eaten should be vegetables and fruit. Three meals at set times with snacks allowed between meals, but they must be fruit or vegetables. Aim to eat over a 12 hour period , example 7 am to 7 pm, and STOP after  your last meal of the day. Drink water,generally about 64 ounces per day, no other drink is as healthy. Fruit juice is best enjoyed in a healthy way, by EATING the fruit.  Thanks for choosing [facility] we consider it a privelige to serve you.

## 2024-11-12 NOTE — Assessment & Plan Note (Signed)
" °  Headaches improved. Propranolol  dosing suboptimal for headache management. - Adjusted propranolol  to 20 mg oral twice daily for headache management. - Educated on importance of twice daily propranolol  for headache control. "

## 2024-11-20 ENCOUNTER — Ambulatory Visit: Payer: Self-pay | Admitting: Neurology

## 2025-03-11 ENCOUNTER — Ambulatory Visit: Payer: Self-pay | Admitting: Nurse Practitioner
